# Patient Record
Sex: Female | Born: 1970 | Race: White | Hispanic: No | Marital: Married | Smoking: Current every day smoker
Health system: Southern US, Community
[De-identification: ages and names within clinical notes are randomized; demographics above are authoritative.]

## PROBLEM LIST (undated history)

## (undated) DIAGNOSIS — E7800 Pure hypercholesterolemia, unspecified: Secondary | ICD-10-CM

## (undated) DIAGNOSIS — K219 Gastro-esophageal reflux disease without esophagitis: Secondary | ICD-10-CM

## (undated) DIAGNOSIS — D649 Anemia, unspecified: Secondary | ICD-10-CM

## (undated) DIAGNOSIS — I10 Essential (primary) hypertension: Secondary | ICD-10-CM

## (undated) HISTORY — DX: Pure hypercholesterolemia, unspecified: E78.00

## (undated) HISTORY — PX: ABDOMINAL HYSTERECTOMY: SHX81

## (undated) HISTORY — DX: Essential (primary) hypertension: I10

## (undated) HISTORY — DX: Gastro-esophageal reflux disease without esophagitis: K21.9

---

## 2002-03-28 HISTORY — PX: APPENDECTOMY: SHX54

## 2017-10-31 ENCOUNTER — Ambulatory Visit (INDEPENDENT_AMBULATORY_CARE_PROVIDER_SITE_OTHER): Payer: Self-pay | Admitting: Cardiovascular Disease

## 2017-10-31 ENCOUNTER — Encounter: Payer: Self-pay | Admitting: *Deleted

## 2017-10-31 ENCOUNTER — Encounter: Payer: Self-pay | Admitting: Cardiovascular Disease

## 2017-10-31 VITALS — BP 144/86 | HR 70 | Ht 68.0 in | Wt 214.0 lb

## 2017-10-31 DIAGNOSIS — R9431 Abnormal electrocardiogram [ECG] [EKG]: Secondary | ICD-10-CM

## 2017-10-31 DIAGNOSIS — I10 Essential (primary) hypertension: Secondary | ICD-10-CM

## 2017-10-31 DIAGNOSIS — E785 Hyperlipidemia, unspecified: Secondary | ICD-10-CM

## 2017-10-31 DIAGNOSIS — R079 Chest pain, unspecified: Secondary | ICD-10-CM

## 2017-10-31 MED ORDER — ASPIRIN EC 81 MG PO TBEC: 81.0000 mg | DELAYED_RELEASE_TABLET | Freq: Every day | ORAL | Status: DC

## 2017-10-31 NOTE — Progress Notes (Signed)
CARDIOLOGY CONSULT NOTE  Patient ID: [name] MRN: [id number] DOB/AGE: [birth year] 47 y.o.  Admit date: (Not on file) Primary Physician: Manon Hilding, MD Referring Physician: Manon Hilding, MD  Reason for Consultation: Chest pain  HPI: Amanda Morrow is a 47 y.o. female who is being seen today for the evaluation of chest pain at the request of Sasser, Silvestre Moment, MD.   Past medical history includes hypertension.  I reviewed records from her PCP.  It appears she has been expensing chest pain radiating to her left arm.  I reviewed an ECG performed on 10/04/2017 which demonstrated normal sinus rhythm with T wave inversions in 1, aVL, and V4 through V6 with nonspecific T wave abnormalities in V3.  She has been experiencing intermittent retrosternal chest pain for the past 5 weeks.  She describes as a dull ache.  It is associated with left shoulder discomfort.  Symptoms can occur both at rest and with exertion.  She was prescribed nitroglycerin by her PCP but has not had to use any.  She says she has hypercholesterolemia but is not currently being treated for it.  She has occasional palpitations.  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  She said she has had hypertension and been treated for it since 2004.  She also had pregnancy-induced hypertension at the age of 39.  She quit smoking on New Year's Eve of this year.  She had been smoking a pack of cigarettes daily for 3 to 4 years.  Family history: Father had three-vessel CABG in his late 11s.  He was a non-smoker.  Maternal grandfather had four-vessel CABG.   Allergies  Allergen Reactions  . Morphine And Related     "makes me violent"     Current Outpatient Medications  Medication Sig Dispense Refill  . hydrochlorothiazide (HYDRODIURIL) 25 MG tablet Take 25 mg by mouth daily.    . nitroGLYCERIN (NITROSTAT) 0.4 MG SL tablet Place 0.4 mg under the tongue every 5 (five) minutes as needed for chest pain.    Marland Kitchen  omeprazole (PRILOSEC OTC) 20 MG tablet Take 20 mg by mouth daily.     No current facility-administered medications for this visit.     Past Medical History:  Diagnosis Date  . Gastroesophageal reflux   . Hypercholesterolemia   . Hypertension     Past Surgical History:  Procedure Laterality Date  . APPENDECTOMY  2004    Social History   Socioeconomic History  . Marital status: Married    Spouse name: Not on file  . Number of children: Not on file  . Years of education: Not on file  . Highest education level: Not on file  Occupational History  . Not on file  Social Needs  . Financial resource strain: Not on file  . Food insecurity:    Worry: Not on file    Inability: Not on file  . Transportation needs:    Medical: Not on file    Non-medical: Not on file  Tobacco Use  . Smoking status: Former Smoker    Types: Cigarettes  . Smokeless tobacco: Never Used  Substance and Sexual Activity  . Alcohol use: Not on file  . Drug use: Not on file  . Sexual activity: Not on file  Lifestyle  . Physical activity:    Days per week: Not on file    Minutes per session: Not on file  . Stress: Not on file  Relationships  .  Social connections:    Talks on phone: Not on file    Gets together: Not on file    Attends religious service: Not on file    Active member of club or organization: Not on file    Attends meetings of clubs or organizations: Not on file    Relationship status: Not on file  . Intimate partner violence:    Fear of current or ex partner: Not on file    Emotionally abused: Not on file    Physically abused: Not on file    Forced sexual activity: Not on file  Other Topics Concern  . Not on file  Social History Narrative  . Not on file     No family history of premature CAD in 1st degree relatives.  Current Meds  Medication Sig  . hydrochlorothiazide (HYDRODIURIL) 25 MG tablet Take 25 mg by mouth daily.  . nitroGLYCERIN (NITROSTAT) 0.4 MG SL tablet Place  0.4 mg under the tongue every 5 (five) minutes as needed for chest pain.  Marland Kitchen omeprazole (PRILOSEC OTC) 20 MG tablet Take 20 mg by mouth daily.      Review of systems complete and found to be negative unless listed above in HPI    Physical exam Blood pressure (!) 144/86, pulse 70, height 5\' 8"  (1.727 m), weight 214 lb (97.1 kg), SpO2 96 %. General: NAD Neck: No JVD, no thyromegaly or thyroid nodule.  Lungs: Clear to auscultation bilaterally with normal respiratory effort. CV: Nondisplaced PMI. Regular rate and rhythm, normal S1/S2, no S3/S4, no murmur.  No peripheral edema.  No carotid bruit.  Abdomen: Soft, nontender, no distention.  Skin: Intact without lesions or rashes.  Neurologic: Alert and oriented x 3.  Psych: Normal affect. Extremities: No clubbing or cyanosis.  HEENT: Normal.   ECG: Most recent ECG reviewed.   Labs: No results found for: K, BUN, CREATININE, ALT, TSH, HGB   Lipids: No results found for: LDLCALC, LDLDIRECT, CHOL, TRIG, HDL      ASSESSMENT AND PLAN:  1.  Chest pain with abnormal ECG: ECG is markedly abnormal suspicious for ischemic heart disease.  There is a family history of coronary artery disease.  I will obtain an exercise Myoview stress test.  She has sublingual nitroglycerin but has not had to use it.  I will start aspirin 81 mg daily.  I will obtain a copy of lipids from PCP. I will order a 2-D echocardiogram with Doppler to evaluate cardiac structure, function, and regional wall motion.  2.  Hypertension: Blood pressure is mildly elevated.  This will need further monitoring.  3.  Hypercholesteremia: I will obtain a copy of lipids from PCP.    Disposition: Follow up in next several weeks after cardiac testing  Signed: Kate Sable, M.D., F.A.C.C.  10/31/2017, 9:16 AM

## 2017-10-31 NOTE — Patient Instructions (Signed)
Medication Instructions:   Begin Aspirin 81mg  daily.   Continue all other medications.    Labwork: none  Testing/Procedures:  Your physician has requested that you have an echocardiogram. Echocardiography is a painless test that uses sound waves to create images of your heart. It provides your doctor with information about the size and shape of your heart and how well your heart's chambers and valves are working. This procedure takes approximately one hour. There are no restrictions for this procedure.  Your physician has requested that you have an exercise stress myoview. For further information please visit [email or web address]. Please follow instruction sheet, as given.  Office will contact with results via phone or letter.    Follow-Up: Next available with Dr. Bronson Ing   Any Other Special Instructions Will Be Listed Below (If Applicable).  If you need a refill on your cardiac medications before your next appointment, please call your pharmacy.

## 2017-11-02 ENCOUNTER — Ambulatory Visit (HOSPITAL_COMMUNITY)
Admission: RE | Admit: 2017-11-02 | Discharge: 2017-11-02 | Disposition: A | Discharge status: Home / Self Care | Payer: Self-pay | Source: Ambulatory Visit | Attending: Cardiovascular Disease | Admitting: Cardiovascular Disease

## 2017-11-02 ENCOUNTER — Encounter (HOSPITAL_COMMUNITY)
Admission: RE | Admit: 2017-11-02 | Discharge: 2017-11-02 | Disposition: A | Discharge status: Home / Self Care | Payer: Self-pay | Source: Ambulatory Visit | Attending: Cardiovascular Disease | Admitting: Cardiovascular Disease

## 2017-11-02 ENCOUNTER — Encounter (HOSPITAL_BASED_OUTPATIENT_CLINIC_OR_DEPARTMENT_OTHER)
Admission: RE | Admit: 2017-11-02 | Discharge: 2017-11-02 | Disposition: A | Discharge status: Home / Self Care | Payer: Self-pay | Source: Ambulatory Visit | Attending: Cardiovascular Disease | Admitting: Cardiovascular Disease

## 2017-11-02 ENCOUNTER — Encounter (HOSPITAL_COMMUNITY): Payer: Self-pay

## 2017-11-02 DIAGNOSIS — R079 Chest pain, unspecified: Secondary | ICD-10-CM | POA: Insufficient documentation

## 2017-11-02 DIAGNOSIS — E7800 Pure hypercholesterolemia, unspecified: Secondary | ICD-10-CM | POA: Insufficient documentation

## 2017-11-02 DIAGNOSIS — R9431 Abnormal electrocardiogram [ECG] [EKG]: Secondary | ICD-10-CM

## 2017-11-02 DIAGNOSIS — K219 Gastro-esophageal reflux disease without esophagitis: Secondary | ICD-10-CM | POA: Insufficient documentation

## 2017-11-02 DIAGNOSIS — I10 Essential (primary) hypertension: Secondary | ICD-10-CM | POA: Insufficient documentation

## 2017-11-02 MED ORDER — TECHNETIUM TC 99M TETROFOSMIN IV KIT
10.0000 | PACK | Freq: Once | INTRAVENOUS | Status: AC | PRN
  Administered 2017-11-02: 10.3 via INTRAVENOUS

## 2017-11-02 MED ORDER — TECHNETIUM TC 99M TETROFOSMIN IV KIT
30.0000 | PACK | Freq: Once | INTRAVENOUS | Status: AC | PRN
  Administered 2017-11-02: 29 via INTRAVENOUS

## 2017-11-02 MED ORDER — SODIUM CHLORIDE 0.9% FLUSH
INTRAVENOUS | Status: AC
  Administered 2017-11-02: 10 mL via INTRAVENOUS
  Filled 2017-11-02: qty 10

## 2017-11-02 MED ORDER — REGADENOSON 0.4 MG/5ML IV SOLN
INTRAVENOUS | Status: AC
  Filled 2017-11-02: qty 5

## 2017-11-02 NOTE — Progress Notes (Signed)
*  PRELIMINARY RESULTS* Echocardiogram 2D Echocardiogram has been performed.  Amanda Morrow 11/02/2017, 11:16 AM

## 2017-11-03 LAB — NM MYOCAR MULTI W/SPECT W/WALL MOTION / EF
CHL CUP NUCLEAR SRS: 1
CHL CUP RESTING HR STRESS: 66 {beats}/min
CHL RATE OF PERCEIVED EXERTION: 14
CSEPEDS: 17 s
CSEPEW: 10.4 METS
Exercise duration (min): 9 min
LV dias vol: 55 mL (ref 46–106)
LVSYSVOL: 23 mL
MPHR: 173 {beats}/min
Peak HR: 150 {beats}/min
Percent HR: 86 %
RATE: 0.39
SDS: 2
SSS: 3
TID: 0.92

## 2017-11-08 ENCOUNTER — Encounter: Payer: Self-pay | Admitting: *Deleted

## 2017-11-17 ENCOUNTER — Telehealth: Payer: Self-pay | Admitting: *Deleted

## 2017-11-17 ENCOUNTER — Encounter: Payer: Self-pay | Admitting: *Deleted

## 2017-11-17 NOTE — Telephone Encounter (Signed)
Notes recorded by Laurine Blazer, LPN on 12/11/567 at 7:94 PM EDT Patient notified. Copy to pmd. Follow up scheduled for 12/12/2017 in [place] office.   ------  Notes recorded by Laurine Blazer, LPN on 10/27/6551 at 7:48 PM EDT No answer.  ------  Notes recorded by Laurine Blazer, LPN on 2/70/7867 at 5:44 AM EDT No answer. ------  Notes recorded by Herminio Commons, MD on 11/06/2017 at 8:42 AM EDT Low risk for blockages. I will discuss further at follow up visit.

## 2017-12-12 ENCOUNTER — Ambulatory Visit (INDEPENDENT_AMBULATORY_CARE_PROVIDER_SITE_OTHER): Payer: Self-pay | Admitting: Cardiovascular Disease

## 2017-12-12 ENCOUNTER — Encounter: Payer: Self-pay | Admitting: Cardiovascular Disease

## 2017-12-12 VITALS — BP 144/88 | HR 74 | Ht 68.0 in | Wt 222.0 lb

## 2017-12-12 DIAGNOSIS — E785 Hyperlipidemia, unspecified: Secondary | ICD-10-CM

## 2017-12-12 DIAGNOSIS — I10 Essential (primary) hypertension: Secondary | ICD-10-CM

## 2017-12-12 DIAGNOSIS — R9431 Abnormal electrocardiogram [ECG] [EKG]: Secondary | ICD-10-CM

## 2017-12-12 DIAGNOSIS — R079 Chest pain, unspecified: Secondary | ICD-10-CM

## 2017-12-12 DIAGNOSIS — F064 Anxiety disorder due to known physiological condition: Secondary | ICD-10-CM

## 2017-12-12 DIAGNOSIS — F410 Panic disorder [episodic paroxysmal anxiety] without agoraphobia: Secondary | ICD-10-CM

## 2017-12-12 MED ORDER — AMLODIPINE BESYLATE 5 MG PO TABS: 5.0000 mg | ORAL_TABLET | Freq: Every day | ORAL | 3 refills | Status: DC

## 2017-12-12 NOTE — Patient Instructions (Signed)
Your physician recommends that you schedule a follow-up appointment in:  4 months with Dr.Koneswaran      START Amlodipine 5 mg daily    If you need a refill on your cardiac medications before your next appointment, please call your pharmacy.      No lab work or tests ordered today.      Thank you for choosing [facility] !

## 2017-12-12 NOTE — Progress Notes (Signed)
SUBJECTIVE: The patient returns for follow-up after undergoing cardiovascular testing performed for the evaluation of chest pain.  The patient underwent a low risk nuclear stress test on 11/02/2017.  There was horizontal ST segment depression during stress in leads III and aVF returning to baseline after less than 1 minute of recovery.  Myocardial perfusion was normal.  Echocardiogram demonstrated normal left ventricular systolic function, LVEF 60 to 65%.  She has had some chest pains since her last visit with me but none have been as severe when she initially experience them.  She previously lost 63 pounds.  She knows she is carrying excessive weight and plans to lose it.  She has had some anxiety and panic attacks recently.  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  Family history: Father had three-vessel CABG in his late 46s.  He was a non-smoker.  Maternal grandfather had four-vessel CABG.   Review of Systems: As per "subjective", otherwise negative.  Allergies  Allergen Reactions  . Morphine And Related     "makes me violently sick"     Current Outpatient Medications  Medication Sig Dispense Refill  . aspirin EC 81 MG tablet Take 1 tablet (81 mg total) by mouth daily.    . hydrochlorothiazide (HYDRODIURIL) 25 MG tablet Take 25 mg by mouth daily.    . nitroGLYCERIN (NITROSTAT) 0.4 MG SL tablet Place 0.4 mg under the tongue every 5 (five) minutes as needed for chest pain.    Marland Kitchen omeprazole (PRILOSEC OTC) 20 MG tablet Take 20 mg by mouth daily.     No current facility-administered medications for this visit.     Past Medical History:  Diagnosis Date  . Gastroesophageal reflux   . Hypercholesterolemia   . Hypertension     Past Surgical History:  Procedure Laterality Date  . APPENDECTOMY  2004    Social History   Socioeconomic History  . Marital status: Married    Spouse name: Not on file  . Number of children: Not on file  . Years of education:  Not on file  . Highest education level: Not on file  Occupational History  . Not on file  Social Needs  . Financial resource strain: Not on file  . Food insecurity:    Worry: Not on file    Inability: Not on file  . Transportation needs:    Medical: Not on file    Non-medical: Not on file  Tobacco Use  . Smoking status: Former Smoker    Types: Cigarettes    Last attempt to quit: 03/13/2017    Years since quitting: 0.7  . Smokeless tobacco: Never Used  Substance and Sexual Activity  . Alcohol use: Yes    Comment: occasionally  . Drug use: Not Currently  . Sexual activity: Not on file  Lifestyle  . Physical activity:    Days per week: Not on file    Minutes per session: Not on file  . Stress: Not on file  Relationships  . Social connections:    Talks on phone: Not on file    Gets together: Not on file    Attends religious service: Not on file    Active member of club or organization: Not on file    Attends meetings of clubs or organizations: Not on file    Relationship status: Not on file  . Intimate partner violence:    Fear of current or ex partner: Not on file    Emotionally  abused: Not on file    Physically abused: Not on file    Forced sexual activity: Not on file  Other Topics Concern  . Not on file  Social History Narrative  . Not on file     Vitals:   12/12/17 0950  BP: (!) 144/88  Pulse: 74  SpO2: 98%  Weight: 222 lb (100.7 kg)  Height: 5\' 8"  (1.727 m)    Wt Readings from Last 3 Encounters:  12/12/17 222 lb (100.7 kg)  10/31/17 214 lb (97.1 kg)  10/04/17 214 lb (97.1 kg)     PHYSICAL EXAM General: NAD HEENT: Normal. Neck: No JVD, no thyromegaly. Lungs: Clear to auscultation bilaterally with normal respiratory effort. CV: Regular rate and rhythm, normal S1/S2, no S3/S4, no murmur. No pretibial or periankle edema.  No carotid bruit.   Abdomen: Soft, nontender, no distention.  Neurologic: Alert and oriented.  Psych: Normal affect. Skin:  Normal. Musculoskeletal: No gross deformities.    ECG: Reviewed above under Subjective   Labs: No results found for: K, BUN, CREATININE, ALT, TSH, HGB   Lipids: No results found for: LDLCALC, LDLDIRECT, CHOL, TRIG, HDL     ASSESSMENT AND PLAN:  1.  Chest pain with abnormal ECG: ECG is markedly abnormal and suspicious for ischemic heart disease.  There is a family history of coronary artery disease.  However, nuclear stress test was low risk as detailed above.  Myocardial perfusion was normal but there were ECG abnormalities.  Echocardiogram demonstrated normal left ventricular systolic function.  For the time being, I will aim to control blood pressure.  2.  Hypertension: Blood pressure is mildly elevated.    I will start amlodipine 5 mg daily.  3.  Hypercholesteremia: I reviewed lipids dated 06/20/2017.  LDL was 133, total cholesterol 217, triglycerides 278, HDL 41.  4.  Anxiety and panic attacks: I asked her to speak with her PCP regarding this.   Disposition: Follow up 4 months   Kate Sable, M.D., F.A.C.C.

## 2018-04-11 ENCOUNTER — Encounter (INDEPENDENT_AMBULATORY_CARE_PROVIDER_SITE_OTHER): Payer: Self-pay

## 2018-04-11 ENCOUNTER — Ambulatory Visit (INDEPENDENT_AMBULATORY_CARE_PROVIDER_SITE_OTHER): Payer: Self-pay | Admitting: Cardiovascular Disease

## 2018-04-11 ENCOUNTER — Encounter: Payer: Self-pay | Admitting: Cardiovascular Disease

## 2018-04-11 VITALS — BP 132/84 | HR 79 | Ht 68.0 in | Wt 224.0 lb

## 2018-04-11 DIAGNOSIS — I10 Essential (primary) hypertension: Secondary | ICD-10-CM

## 2018-04-11 DIAGNOSIS — R079 Chest pain, unspecified: Secondary | ICD-10-CM

## 2018-04-11 DIAGNOSIS — F410 Panic disorder [episodic paroxysmal anxiety] without agoraphobia: Secondary | ICD-10-CM

## 2018-04-11 DIAGNOSIS — F064 Anxiety disorder due to known physiological condition: Secondary | ICD-10-CM

## 2018-04-11 DIAGNOSIS — E785 Hyperlipidemia, unspecified: Secondary | ICD-10-CM

## 2018-04-11 DIAGNOSIS — R9431 Abnormal electrocardiogram [ECG] [EKG]: Secondary | ICD-10-CM

## 2018-04-11 NOTE — Progress Notes (Signed)
SUBJECTIVE: The patient presents for routine follow-up.  She has a history of chest pain, family history of premature coronary artery disease, anxiety and panic attacks.  The patient underwent a low risk nuclear stress test on 11/02/2017.  There was horizontal ST segment depression during stress in leads III and aVF returning to baseline after less than 1 minute of recovery.  Myocardial perfusion was normal.  Echocardiogram demonstrated normal left ventricular systolic function, LVEF 60 to 65%.  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  She is feeling much better and denies chest pain, palpitations, orthopnea, and shortness of breath.  She recently had some dizziness which she attributes to labyrinthitis.   Family history:Father had three-vessel CABG in his late 71s. He was a non-smoker. Maternal grandfather had four-vessel CABG.  Review of Systems: As per "subjective", otherwise negative.  Allergies  Allergen Reactions  . Morphine And Related     "makes me violently sick"     Current Outpatient Medications  Medication Sig Dispense Refill  . amLODipine (NORVASC) 5 MG tablet Take 1 tablet (5 mg total) by mouth daily. 90 tablet 3  . aspirin EC 81 MG tablet Take 1 tablet (81 mg total) by mouth daily.    . hydrochlorothiazide (HYDRODIURIL) 25 MG tablet Take 25 mg by mouth daily.    . nitroGLYCERIN (NITROSTAT) 0.4 MG SL tablet Place 0.4 mg under the tongue every 5 (five) minutes as needed for chest pain.    Marland Kitchen omeprazole (PRILOSEC OTC) 20 MG tablet Take 20 mg by mouth daily.     No current facility-administered medications for this visit.     Past Medical History:  Diagnosis Date  . Gastroesophageal reflux   . Hypercholesterolemia   . Hypertension     Past Surgical History:  Procedure Laterality Date  . APPENDECTOMY  2004    Social History   Socioeconomic History  . Marital status: Married    Spouse name: Not on file  . Number of children: Not  on file  . Years of education: Not on file  . Highest education level: Not on file  Occupational History  . Not on file  Social Needs  . Financial resource strain: Not on file  . Food insecurity:    Worry: Not on file    Inability: Not on file  . Transportation needs:    Medical: Not on file    Non-medical: Not on file  Tobacco Use  . Smoking status: Former Smoker    Types: Cigarettes    Last attempt to quit: 03/13/2017    Years since quitting: 1.0  . Smokeless tobacco: Never Used  Substance and Sexual Activity  . Alcohol use: Yes    Comment: occasionally  . Drug use: Not Currently  . Sexual activity: Not on file  Lifestyle  . Physical activity:    Days per week: Not on file    Minutes per session: Not on file  . Stress: Not on file  Relationships  . Social connections:    Talks on phone: Not on file    Gets together: Not on file    Attends religious service: Not on file    Active member of club or organization: Not on file    Attends meetings of clubs or organizations: Not on file    Relationship status: Not on file  . Intimate partner violence:    Fear of current or ex partner: Not on file    Emotionally abused:  Not on file    Physically abused: Not on file    Forced sexual activity: Not on file  Other Topics Concern  . Not on file  Social History Narrative  . Not on file     Vitals:   04/11/18 1038  BP: 132/84  Pulse: 79  SpO2: 96%  Weight: 224 lb (101.6 kg)  Height: 5\' 8"  (1.727 m)    Wt Readings from Last 3 Encounters:  04/11/18 224 lb (101.6 kg)  12/12/17 222 lb (100.7 kg)  10/31/17 214 lb (97.1 kg)     PHYSICAL EXAM General: NAD HEENT: Normal. Neck: No JVD, no thyromegaly. Lungs: Clear to auscultation bilaterally with normal respiratory effort. CV: Regular rate and rhythm, normal S1/S2, no S3/S4, no murmur. No pretibial or periankle edema.  No carotid bruit.   Abdomen: Soft, nontender, no distention.  Neurologic: Alert and oriented.    Psych: Normal affect. Skin: Normal. Musculoskeletal: No gross deformities.    ECG: Reviewed above under Subjective   Labs: No results found for: K, BUN, CREATININE, ALT, TSH, HGB   Lipids: No results found for: LDLCALC, LDLDIRECT, CHOL, TRIG, HDL     ASSESSMENT AND PLAN: 1. Chest pain with abnormal ECG: Symptomatically improved with blood pressure control.  ECG is markedly abnormal and suspicious for ischemic heart disease. There is a family history of coronary artery disease.  However, nuclear stress test was low risk as detailed above.  Myocardial perfusion was normal but there were ECG abnormalities.  Echocardiogram demonstrated normal left ventricular systolic function.  I will continue to monitor.  2. Hypertension: Blood pressure is is normal with addition of amlodipine 5 mg daily.  No changes.  3. Hypercholesteremia: I reviewed lipids dated 06/20/2017.  LDL was 133, total cholesterol 217, triglycerides 278, HDL 41.  4.  Anxiety and panic attacks: Symptoms have improved.  I previously asked her to speak with her PCP regarding this.    Disposition: Follow up 6 months   Kate Sable, M.D., F.A.C.C.

## 2018-04-11 NOTE — Patient Instructions (Signed)
Medication Instructions:  Your physician recommends that you continue on your current medications as directed. Please refer to the Current Medication list given to you today.  If you need a refill on your cardiac medications before your next appointment, please call your pharmacy.   Lab work: NONE   If you have labs (blood work) drawn today and your tests are completely normal, you will receive your results only by: . MyChart Message (if you have MyChart) OR . A paper copy in the mail If you have any lab test that is abnormal or we need to change your treatment, we will call you to review the results.  Testing/Procedures: NONE   Follow-Up: At [facility], you and your health needs are our priority.  As part of our continuing mission to provide you with exceptional heart care, we have created designated Provider Care Teams.  These Care Teams include your primary Cardiologist (physician) and Advanced Practice Providers (APPs -  Physician Assistants and Nurse Practitioners) who all work together to provide you with the care you need, when you need it. You will need a follow up appointment in 6 months.  Please call our office 2 months in advance to schedule this appointment.  You may see Suresh Koneswaran, MD or one of the following Advanced Practice Providers on your designated Care Team:   Brittany Strader, PA-C ([place] Office) . Michele Lenze, PA-C ([place] Office)  Any Other Special Instructions Will Be Listed Below (If Applicable). Thank you for choosing [facility]!     

## 2018-10-18 ENCOUNTER — Other Ambulatory Visit: Payer: Self-pay

## 2018-10-18 DIAGNOSIS — Z20822 Contact with and (suspected) exposure to covid-19: Secondary | ICD-10-CM

## 2018-10-21 LAB — NOVEL CORONAVIRUS, NAA: SARS-CoV-2, NAA: NOT DETECTED

## 2018-10-31 ENCOUNTER — Ambulatory Visit: Payer: Self-pay | Admitting: Cardiovascular Disease

## 2018-12-07 ENCOUNTER — Other Ambulatory Visit: Payer: Self-pay | Admitting: *Deleted

## 2018-12-07 MED ORDER — AMLODIPINE BESYLATE 5 MG PO TABS: 5.0000 mg | ORAL_TABLET | Freq: Every day | ORAL | 1 refills | Status: DC

## 2018-12-31 ENCOUNTER — Encounter: Payer: Self-pay | Admitting: Cardiovascular Disease

## 2018-12-31 ENCOUNTER — Other Ambulatory Visit: Payer: Self-pay

## 2018-12-31 ENCOUNTER — Ambulatory Visit (INDEPENDENT_AMBULATORY_CARE_PROVIDER_SITE_OTHER): Payer: Self-pay | Admitting: Cardiovascular Disease

## 2018-12-31 VITALS — BP 158/78 | HR 85 | Ht 68.0 in

## 2018-12-31 DIAGNOSIS — R079 Chest pain, unspecified: Secondary | ICD-10-CM

## 2018-12-31 DIAGNOSIS — R9431 Abnormal electrocardiogram [ECG] [EKG]: Secondary | ICD-10-CM

## 2018-12-31 DIAGNOSIS — I10 Essential (primary) hypertension: Secondary | ICD-10-CM

## 2018-12-31 MED ORDER — AMLODIPINE BESYLATE 10 MG PO TABS: 10.0000 mg | ORAL_TABLET | Freq: Every day | ORAL | 1 refills | Status: DC

## 2018-12-31 NOTE — Patient Instructions (Addendum)
Medication Instructions:   Increase Amlodipine to 10mg  daily.   Continue all other current medications.  Labwork: none  Testing/Procedures: none  Follow-Up: Your physician wants you to follow up in: 6 months.  You will receive a reminder letter in the mail one-two months in advance.  If you don't receive a letter, please call our office to schedule the follow up appointment   Any Other Special Instructions Will Be Listed Below (If Applicable).  If you need a refill on your cardiac medications before your next appointment, please call your pharmacy.

## 2018-12-31 NOTE — Progress Notes (Signed)
SUBJECTIVE:  The patient presents for routine follow-up.  She has a history of chest pain, family history of premature coronary artery disease, anxiety and panic attacks.  The patient underwent a low risk nuclear stress test on 11/02/2017. There was horizontal ST segment depression during stress in leads IIIand aVF returning to baseline after less than 1 minute of recovery. Myocardial perfusion was normal.  Echocardiogram demonstrated normal left ventricular systolic function, LVEF 60 to 65%.  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  The patient denies any symptoms of chest pain, palpitations, shortness of breath, lightheadedness, dizziness, leg swelling, orthopnea, PND, and syncope.  She apparently became anemic and had to be transfused 2 units earlier this year.  She said it was due to heavy menses and she needs a hysterectomy.  Aspirin was discontinued.  She checks her blood pressure about twice per week and it runs in the 150/70 range.  ECG performed today which I personally reviewed demonstrates sinus rhythm with pronounced T wave inversions in leads I and aVL and nonspecific T wave abnormalities in V5 and V6.   Family history:Father had three-vessel CABG in his late 82s. He was a non-smoker. Maternal grandfather had four-vessel CABG.  Review of Systems: As per "subjective", otherwise negative.  Allergies  Allergen Reactions  . Morphine And Related     "makes me violently sick"     Current Outpatient Medications  Medication Sig Dispense Refill  . amLODipine (NORVASC) 5 MG tablet Take 1 tablet (5 mg total) by mouth daily. 90 tablet 1  . Ferrous Sulfate (IRON) 325 (65 Fe) MG TABS Take by mouth.    . hydrochlorothiazide (HYDRODIURIL) 25 MG tablet Take 25 mg by mouth daily.    . nitroGLYCERIN (NITROSTAT) 0.4 MG SL tablet Place 0.4 mg under the tongue every 5 (five) minutes as needed for chest pain.    . potassium chloride (KLOR-CON) 20 MEQ packet Take  20 mEq by mouth daily.     No current facility-administered medications for this visit.     Past Medical History:  Diagnosis Date  . Gastroesophageal reflux   . Hypercholesterolemia   . Hypertension     Past Surgical History:  Procedure Laterality Date  . APPENDECTOMY  2004    Social History   Socioeconomic History  . Marital status: Married    Spouse name: Not on file  . Number of children: Not on file  . Years of education: Not on file  . Highest education level: Not on file  Occupational History  . Not on file  Social Needs  . Financial resource strain: Not on file  . Food insecurity    Worry: Not on file    Inability: Not on file  . Transportation needs    Medical: Not on file    Non-medical: Not on file  Tobacco Use  . Smoking status: Former Smoker    Types: Cigarettes    Quit date: 03/13/2017    Years since quitting: 1.8  . Smokeless tobacco: Never Used  Substance and Sexual Activity  . Alcohol use: Yes    Comment: occasionally  . Drug use: Not Currently  . Sexual activity: Not on file  Lifestyle  . Physical activity    Days per week: Not on file    Minutes per session: Not on file  . Stress: Not on file  Relationships  . Social Herbalist on phone: Not on file    Gets  together: Not on file    Attends religious service: Not on file    Active member of club or organization: Not on file    Attends meetings of clubs or organizations: Not on file    Relationship status: Not on file  . Intimate partner violence    Fear of current or ex partner: Not on file    Emotionally abused: Not on file    Physically abused: Not on file    Forced sexual activity: Not on file  Other Topics Concern  . Not on file  Social History Narrative  . Not on file     Vitals:   12/31/18 1414  BP: (!) 158/78  Pulse: 85  SpO2: 98%  Height: 5\' 8"  (1.727 m)    Wt Readings from Last 3 Encounters:  04/11/18 224 lb (101.6 kg)  12/12/17 222 lb (100.7 kg)   10/31/17 214 lb (97.1 kg)     PHYSICAL EXAM General: NAD HEENT: Normal. Neck: No JVD, no thyromegaly. Lungs: Clear to auscultation bilaterally with normal respiratory effort. CV: Regular rate and rhythm, normal S1/S2, no S3/S4, no murmur. No pretibial or periankle edema.  No carotid bruit.   Abdomen: Soft, nontender, no distention.  Neurologic: Alert and oriented.  Psych: Normal affect. Skin: Normal. Musculoskeletal: No gross deformities.      Labs: No results found for: K, BUN, CREATININE, ALT, TSH, HGB   Lipids: No results found for: LDLCALC, LDLDIRECT, CHOL, TRIG, HDL     ASSESSMENT AND PLAN: 1. Chest pain with abnormal ECG:  She denies anginal symptoms.  ECG is markedly abnormalandsuspicious for ischemic heart disease. There is a family history of coronary artery disease.However, nuclear stress test was low risk as detailed above. Myocardial perfusion was normal but there were ECG abnormalities. Echocardiogram demonstrated normal left ventricular systolic function.  I will continue to monitor.  Aspirin was stopped due to anemia.  2. Hypertension: Blood pressure is  markedly elevated.  I will increase amlodipine to 10 mg daily.    Disposition: Follow up 6 months   Kate Sable, M.D., F.A.C.C.

## 2019-01-01 ENCOUNTER — Encounter: Payer: Self-pay | Admitting: *Deleted

## 2019-04-08 ENCOUNTER — Other Ambulatory Visit: Payer: Self-pay | Admitting: Cardiovascular Disease

## 2019-08-13 ENCOUNTER — Ambulatory Visit (INDEPENDENT_AMBULATORY_CARE_PROVIDER_SITE_OTHER): Payer: Self-pay | Admitting: Cardiovascular Disease

## 2019-08-13 ENCOUNTER — Encounter: Payer: Self-pay | Admitting: Cardiovascular Disease

## 2019-08-13 ENCOUNTER — Other Ambulatory Visit: Payer: Self-pay

## 2019-08-13 VITALS — BP 138/80 | HR 78 | Ht 68.0 in | Wt 220.0 lb

## 2019-08-13 DIAGNOSIS — R079 Chest pain, unspecified: Secondary | ICD-10-CM

## 2019-08-13 DIAGNOSIS — I10 Essential (primary) hypertension: Secondary | ICD-10-CM

## 2019-08-13 DIAGNOSIS — R9431 Abnormal electrocardiogram [ECG] [EKG]: Secondary | ICD-10-CM

## 2019-08-13 NOTE — Progress Notes (Signed)
SUBJECTIVE:  The patient presents for routine follow-up.  She has a history of chest pain, family history of premature coronary artery disease, anxiety and panic attacks.  The patient underwent a low risk nuclear stress test on 11/02/2017. There was horizontal ST segment depression during stress in leads IIIand aVF returning to baseline after less than 1 minute of recovery. Myocardial perfusion was normal.  Echocardiogram demonstrated normal left ventricular systolic function, LVEF 60 to 65%.  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  The patient denies any symptoms of chest pain, palpitations, shortness of breath, lightheadedness, dizziness, orthopnea, PND, and syncope.  She has occasional ankle and feet swelling if she has been up on her feet for long periods of time.  She apparently became anemic and had to be transfused 2 units in 2020.  She said it was due to heavy menses and she needed a hysterectomy.  Aspirin was discontinued.    Family history:Father had three-vessel CABG in his late 38s. He was a non-smoker. Maternal grandfather had four-vessel CABG.  Review of Systems: As per "subjective", otherwise negative.  Allergies  Allergen Reactions  . Morphine And Related     "makes me violently sick"     Current Outpatient Medications  Medication Sig Dispense Refill  . amLODipine (NORVASC) 10 MG tablet TAKE ONE TABLET BY MOUTH DAILY. 90 tablet 1  . Ferrous Sulfate (IRON) 325 (65 Fe) MG TABS Take by mouth.    . hydrochlorothiazide (HYDRODIURIL) 25 MG tablet Take 25 mg by mouth daily.    . nitroGLYCERIN (NITROSTAT) 0.4 MG SL tablet Place 0.4 mg under the tongue every 5 (five) minutes as needed for chest pain.    . potassium chloride (KLOR-CON) 20 MEQ packet Take 20 mEq by mouth daily.     No current facility-administered medications for this visit.    Past Medical History:  Diagnosis Date  . Gastroesophageal reflux   . Hypercholesterolemia   .  Hypertension     Past Surgical History:  Procedure Laterality Date  . APPENDECTOMY  2004    Social History   Socioeconomic History  . Marital status: Married    Spouse name: Not on file  . Number of children: Not on file  . Years of education: Not on file  . Highest education level: Not on file  Occupational History  . Not on file  Tobacco Use  . Smoking status: Former Smoker    Types: Cigarettes    Quit date: 03/13/2017    Years since quitting: 2.4  . Smokeless tobacco: Never Used  Substance and Sexual Activity  . Alcohol use: Yes    Comment: occasionally  . Drug use: Not Currently  . Sexual activity: Not on file  Other Topics Concern  . Not on file  Social History Narrative  . Not on file   Social Determinants of Health   Financial Resource Strain:   . Difficulty of Paying Living Expenses:   Food Insecurity:   . Worried About Charity fundraiser in the Last Year:   . Arboriculturist in the Last Year:   Transportation Needs:   . Film/video editor (Medical):   Marland Kitchen Lack of Transportation (Non-Medical):   Physical Activity:   . Days of Exercise per Week:   . Minutes of Exercise per Session:   Stress:   . Feeling of Stress :   Social Connections:   . Frequency of Communication with Friends and Family:   .  Frequency of Social Gatherings with Friends and Family:   . Attends Religious Services:   . Active Member of Clubs or Organizations:   . Attends Archivist Meetings:   Marland Kitchen Marital Status:   Intimate Partner Violence:   . Fear of Current or Ex-Partner:   . Emotionally Abused:   Marland Kitchen Physically Abused:   . Sexually Abused:      Vitals:   08/13/19 1335  BP: 138/80  Pulse: 78  SpO2: 98%  Weight: 220 lb (99.8 kg)  Height: 5\' 8"  (1.727 m)    Wt Readings from Last 3 Encounters:  08/13/19 220 lb (99.8 kg)  04/11/18 224 lb (101.6 kg)  12/12/17 222 lb (100.7 kg)     PHYSICAL EXAM General: NAD HEENT: Normal. Neck: No JVD, no  thyromegaly. Lungs: Clear to auscultation bilaterally with normal respiratory effort. CV: Regular rate and rhythm, normal S1/S2, no S3/S4, no murmur.  Trace bilateral periankle edema.  No carotid bruit.   Abdomen: Soft, nontender, no distention.  Neurologic: Alert and oriented.  Psych: Normal affect. Skin: Normal. Musculoskeletal: No gross deformities.      Labs: No results found for: K, BUN, CREATININE, ALT, TSH, HGB   Lipids: No results found for: LDLCALC, LDLDIRECT, CHOL, TRIG, HDL     ASSESSMENT AND PLAN: 1. Chest pain with abnormal ECG:  She denies anginal symptoms.  ECG is markedly abnormalandsuspicious for ischemic heart disease. There is a family history of coronary artery disease.However, nuclear stress test was low risk as detailed above. Myocardial perfusion was normal but there were ECG abnormalities. Echocardiogram demonstrated normal left ventricular systolic function.  I will continue to monitor.  Aspirin was stopped due to anemia.  2. Hypertension: Blood pressure is  normal.  No changes to therapy.   Disposition: Follow up 1 year   Kate Sable, M.D., F.A.C.C.

## 2019-08-13 NOTE — Patient Instructions (Signed)

## 2019-08-15 ENCOUNTER — Other Ambulatory Visit: Payer: Self-pay

## 2019-08-15 ENCOUNTER — Ambulatory Visit (INDEPENDENT_AMBULATORY_CARE_PROVIDER_SITE_OTHER): Payer: Self-pay | Admitting: Obstetrics & Gynecology

## 2019-08-15 ENCOUNTER — Encounter: Payer: Self-pay | Admitting: Obstetrics & Gynecology

## 2019-08-15 VITALS — BP 157/88 | HR 84 | Ht 68.0 in | Wt 221.0 lb

## 2019-08-15 DIAGNOSIS — N946 Dysmenorrhea, unspecified: Secondary | ICD-10-CM

## 2019-08-15 DIAGNOSIS — D219 Benign neoplasm of connective and other soft tissue, unspecified: Secondary | ICD-10-CM

## 2019-08-15 DIAGNOSIS — N921 Excessive and frequent menstruation with irregular cycle: Secondary | ICD-10-CM

## 2019-08-15 LAB — POCT HEMOGLOBIN: Hemoglobin: 14.8 g/dL — AB (ref 11–14.6)

## 2019-08-15 MED ORDER — MEGESTROL ACETATE 40 MG PO TABS: ORAL_TABLET | ORAL | 3 refills | Status: DC

## 2019-08-15 NOTE — Progress Notes (Signed)
Chief Complaint  Patient presents with  . has heavy periods and cramping    interested in a hyst.      49 y.o. G1P1001 Patient's last menstrual period was 08/08/2019. The current method of family planning is none.  Outpatient Encounter Medications as of 08/15/2019  Medication Sig  . amLODipine (NORVASC) 10 MG tablet TAKE ONE TABLET BY MOUTH DAILY.  Marland Kitchen Ferrous Sulfate (IRON) 325 (65 Fe) MG TABS Take by mouth.  . hydrochlorothiazide (HYDRODIURIL) 25 MG tablet Take 25 mg by mouth daily.  . nitroGLYCERIN (NITROSTAT) 0.4 MG SL tablet Place 0.4 mg under the tongue every 5 (five) minutes as needed for chest pain.  . potassium chloride (KLOR-CON) 20 MEQ packet Take 20 mEq by mouth daily.  . megestrol (MEGACE) 40 MG tablet 3 tablets a day for 5 days, 2 tablets a day for 5 days then 1 tablet daily   No facility-administered encounter medications on file as of 08/15/2019.    Subjective Pt with fibroids 20 weeks size Has heavy long periods Last 2 weeks Was transfused last August 2 units due to anemia  [facility] R Normal Pap Normal EMBx Wears tampons and pads together Soils clothes sheets Double pads at night Sleeps on a towel  Past Medical History:  Diagnosis Date  . Gastroesophageal reflux   . Hypercholesterolemia   . Hypertension     Past Surgical History:  Procedure Laterality Date  . APPENDECTOMY  2004  . CESAREAN SECTION      OB History    Gravida  1   Para  1   Term  1   Preterm      AB      Living  1     SAB      TAB      Ectopic      Multiple      Live Births  1           Allergies  Allergen Reactions  . Morphine And Related     "makes me violently sick"     Social History   Socioeconomic History  . Marital status: Married    Spouse name: Not on file  . Number of children: Not on file  . Years of education: Not on file  . Highest education level: Not on file  Occupational History  . Not on file  Tobacco Use  . Smoking status:  Current Every Day Smoker    Types: Cigarettes    Last attempt to quit: 03/13/2017    Years since quitting: 2.4  . Smokeless tobacco: Never Used  Substance and Sexual Activity  . Alcohol use: Yes    Comment: occasionally  . Drug use: Not Currently  . Sexual activity: Yes    Birth control/protection: None  Other Topics Concern  . Not on file  Social History Narrative  . Not on file   Social Determinants of Health   Financial Resource Strain: Low Risk   . Difficulty of Paying Living Expenses: Not very hard  Food Insecurity: No Food Insecurity  . Worried About Charity fundraiser in the Last Year: Never true  . Ran Out of Food in the Last Year: Never true  Transportation Needs: No Transportation Needs  . Lack of Transportation (Medical): No  . Lack of Transportation (Non-Medical): No  Physical Activity: Inactive  . Days of Exercise per Week: 0 days  . Minutes of Exercise per Session: 0 min  Stress: No Stress  Concern Present  . Feeling of Stress : Not at all  Social Connections: Somewhat Isolated  . Frequency of Communication with Friends and Family: Three times a week  . Frequency of Social Gatherings with Friends and Family: Once a week  . Attends Religious Services: Never  . Active Member of Clubs or Organizations: No  . Attends Archivist Meetings: Never  . Marital Status: Married    Family History  Problem Relation Age of Onset  . Hypertension Mother   . Diabetes Mother   . Cancer Mother   . Diabetes Father   . Hypertension Father   . Cancer Father     Medications:       Current Outpatient Medications:  .  amLODipine (NORVASC) 10 MG tablet, TAKE ONE TABLET BY MOUTH DAILY., Disp: 90 tablet, Rfl: 1 .  Ferrous Sulfate (IRON) 325 (65 Fe) MG TABS, Take by mouth., Disp: , Rfl:  .  hydrochlorothiazide (HYDRODIURIL) 25 MG tablet, Take 25 mg by mouth daily., Disp: , Rfl:  .  nitroGLYCERIN (NITROSTAT) 0.4 MG SL tablet, Place 0.4 mg under the tongue every 5  (five) minutes as needed for chest pain., Disp: , Rfl:  .  potassium chloride (KLOR-CON) 20 MEQ packet, Take 20 mEq by mouth daily., Disp: , Rfl:  .  megestrol (MEGACE) 40 MG tablet, 3 tablets a day for 5 days, 2 tablets a day for 5 days then 1 tablet daily, Disp: 45 tablet, Rfl: 3  Objective Blood pressure (!) 157/88, pulse 84, height 5\' 8"  (1.727 m), weight 221 lb (100.2 kg), last menstrual period 08/08/2019.  General [facility] female NAD Vulva:  normal appearing vulva with no masses, tenderness or lesions Vagina:  normal mucosa, no discharge Cervix:  Normal no lesions Uterus:  20 weeks size Adnexa: ovaries:present,  normal adnexa in size, nontender and no masses   Pertinent ROS No burning with urination, frequency or urgency No nausea, vomiting or diarrhea Nor fever chills or other constitutional symptoms   Labs or studies Reviewed her records    Impression Diagnoses this Encounter::   ICD-10-CM   1. Fibroids, 20 weeks size  D21.9   2. Menometrorrhagia  N92.1 POCT hemoglobin  3. Dysmenorrhea  N94.6     Established relevant diagnosis(es):   Plan/Recommendations: Meds ordered this encounter  Medications  . megestrol (MEGACE) 40 MG tablet    Sig: 3 tablets a day for 5 days, 2 tablets a day for 5 days then 1 tablet daily    Dispense:  45 tablet    Refill:  3    Labs or Scans Ordered: Orders Placed This Encounter  Procedures  . POCT hemoglobin    Management:: Megestrol therapy until surgery [facility] BSO 09/18/19  Follow up Return in about 6 weeks (around 09/24/2019) for [place], with Dr Elonda Husky.      All questions were answered.

## 2019-09-12 ENCOUNTER — Other Ambulatory Visit: Payer: Self-pay | Admitting: Obstetrics & Gynecology

## 2019-09-16 ENCOUNTER — Encounter (HOSPITAL_COMMUNITY): Payer: Self-pay

## 2019-09-16 ENCOUNTER — Other Ambulatory Visit: Payer: Self-pay

## 2019-09-16 ENCOUNTER — Other Ambulatory Visit (HOSPITAL_COMMUNITY)
Admission: RE | Admit: 2019-09-16 | Discharge: 2019-09-16 | Disposition: A | Discharge status: Home / Self Care | Payer: Self-pay | Source: Ambulatory Visit | Attending: Obstetrics & Gynecology | Admitting: Obstetrics & Gynecology

## 2019-09-16 ENCOUNTER — Encounter (HOSPITAL_COMMUNITY)
Admission: RE | Admit: 2019-09-16 | Discharge: 2019-09-16 | Disposition: A | Discharge status: Home / Self Care | Payer: Self-pay | Source: Ambulatory Visit | Attending: Obstetrics & Gynecology | Admitting: Obstetrics & Gynecology

## 2019-09-16 DIAGNOSIS — Z01818 Encounter for other preprocedural examination: Secondary | ICD-10-CM | POA: Insufficient documentation

## 2019-09-16 DIAGNOSIS — Z20822 Contact with and (suspected) exposure to covid-19: Secondary | ICD-10-CM | POA: Insufficient documentation

## 2019-09-16 HISTORY — DX: Anemia, unspecified: D64.9

## 2019-09-16 LAB — URINALYSIS, ROUTINE W REFLEX MICROSCOPIC
Bacteria, UA: NONE SEEN
Bilirubin Urine: NEGATIVE
Glucose, UA: NEGATIVE mg/dL
Ketones, ur: NEGATIVE mg/dL
Leukocytes,Ua: NEGATIVE
Nitrite: NEGATIVE
Protein, ur: NEGATIVE mg/dL
Specific Gravity, Urine: 1.013 (ref 1.005–1.030)
pH: 6 (ref 5.0–8.0)

## 2019-09-16 LAB — COMPREHENSIVE METABOLIC PANEL
ALT: 25 U/L (ref 0–44)
AST: 13 U/L — ABNORMAL LOW (ref 15–41)
Albumin: 4.5 g/dL (ref 3.5–5.0)
Alkaline Phosphatase: 45 U/L (ref 38–126)
Anion gap: 11 (ref 5–15)
BUN: 12 mg/dL (ref 6–20)
CO2: 24 mmol/L (ref 22–32)
Calcium: 9.9 mg/dL (ref 8.9–10.3)
Chloride: 101 mmol/L (ref 98–111)
Creatinine, Ser: 0.8 mg/dL (ref 0.44–1.00)
GFR calc Af Amer: >60 mL/min (ref >60)
GFR calc non Af Amer: >60 mL/min (ref >60)
Glucose, Bld: 108 mg/dL — ABNORMAL HIGH (ref 70–99)
Potassium: 3.3 mmol/L — ABNORMAL LOW (ref 3.5–5.1)
Sodium: 136 mmol/L (ref 135–145)
Total Bilirubin: 0.6 mg/dL (ref 0.3–1.2)
Total Protein: 8.1 g/dL (ref 6.5–8.1)

## 2019-09-16 LAB — CBC
HCT: 43.7 % (ref 36.0–46.0)
Hemoglobin: 14.9 g/dL (ref 12.0–15.0)
MCH: 31.2 pg (ref 26.0–34.0)
MCHC: 34.1 g/dL (ref 30.0–36.0)
MCV: 91.4 fL (ref 80.0–100.0)
Platelets: 337 10*3/uL (ref 150–400)
RBC: 4.78 MIL/uL (ref 3.87–5.11)
RDW: 12.6 % (ref 11.5–15.5)
WBC: 8.4 10*3/uL (ref 4.0–10.5)
nRBC: 0 % (ref 0.0–0.2)

## 2019-09-16 LAB — RAPID HIV SCREEN (HIV 1/2 AB+AG)
HIV 1/2 Antibodies: NONREACTIVE
HIV-1 P24 Antigen - HIV24: NONREACTIVE

## 2019-09-16 LAB — SARS CORONAVIRUS 2 (TAT 6-24 HRS): SARS Coronavirus 2: NEGATIVE

## 2019-09-16 LAB — HCG, QUANTITATIVE, PREGNANCY: hCG, Beta Chain, Quant, S: <1 m[IU]/mL (ref <5)

## 2019-09-16 NOTE — Patient Instructions (Signed)
Amanda Morrow  09/16/2019     @PREFPERIOPPHARMACY @   Your procedure is scheduled on  09/18/2019 .  Report to [facility] at  0700  A.M.  Call this number if you have problems the morning of surgery:  [phone]   Remember:  Do not eat or drink after midnight.                        Take these medicines the morning of surgery with A SIP OF WATER  Amlodipine.    Do not wear jewelry, make-up or nail polish.  Do not wear lotions, powders, or perfumes. Please wear deodorant and brush your teeth.  Do not shave 48 hours prior to surgery.  Men may shave face and neck.  Do not bring valuables to the hospital.  [facility] is not responsible for any belongings or valuables.  Contacts, dentures or bridgework may not be worn into surgery.  Leave your suitcase in the car.  After surgery it may be brought to your room.  For patients admitted to the hospital, discharge time will be determined by your treatment team.  Patients discharged the day of surgery will not be allowed to drive home.   Name and phone number of your driver:   family Special instructions:  DO NOT smoke the morning of your procedure.  Please read over the following fact sheets that you were given. Anesthesia Post-op Instructions and Care and Recovery After Surgery       Bilateral Salpingo-Oophorectomy, Care After This sheet gives you information about how to care for yourself after your procedure. Your health care provider may also give you more specific instructions. If you have problems or questions, contact your health care provider. What can I expect after the procedure? After the procedure, it is common to have:  Abdominal pain.  Some occasional vaginal bleeding (spotting).  Tiredness.  Symptoms of menopause, such as hot flashes, night sweats, or mood swings. Follow these instructions at home: Incision care   Keep your incision area and your bandage (dressing) clean and dry.  Follow  instructions from your health care provider about how to take care of your incision. Make sure you: ? Wash your hands with soap and water before you change your dressing. If soap and water are not available, use hand sanitizer. ? Change your dressing as told by your health care provider. ? Leave stitches (sutures), staples, skin glue, or adhesive strips in place. These skin closures may need to stay in place for 2 weeks or longer. If adhesive strip edges start to loosen and curl up, you may trim the loose edges. Do not remove adhesive strips completely unless your health care provider tells you to do that.  Check your incision area every day for signs of infection. Check for: ? Redness, swelling, or pain. ? Fluid or blood. ? Warmth. ? Pus or a bad smell. Activity   Do not drive or use heavy machinery while taking prescription pain medicine.  Do not drive for 24 hours if you received a medicine to help you relax (sedative) during your procedure.  Take frequent, short walks throughout the day. Rest when you get tired. Ask your health care provider what activities are safe for you.  Avoid activity that requires great effort. Also, avoid heavy lifting. Do not lift anything that is heavier than 10 lbs. (4.5 kg), or the limit that your health care  provider tells you, until he or she says that it is safe to do so.  Do not douche, use tampons, or have sex until your health care provider approves. General instructions   To prevent or treat constipation while you are taking prescription pain medicine, your health care provider may recommend that you: ? Drink enough fluid to keep your urine clear or pale yellow. ? Take over-the-counter or prescription medicines. ? Eat foods that are high in fiber, such as fresh fruits and vegetables, whole grains, and beans. ? Limit foods that are high in fat and processed sugars, such as fried and sweet foods.  Take over-the-counter and prescription medicines  only as told by your health care provider.  Do not take baths, swim, or use a hot tub until your health care provider approves. Ask your health care provider if you can take showers. You may only be allowed to take sponge baths for bathing.  Wear compression stockings as told by your health care provider. These stockings help to prevent blood clots and reduce swelling in your legs.  Keep all follow-up visits as told by your health care provider. This is important. Contact a health care provider if:  You have pain when you urinate.  You have pus or a bad smelling discharge coming from your vagina.  You have redness, swelling, or pain around your incision.  You have fluid or blood coming from your incision.  Your incision feels warm to the touch.  You have pus or a bad smell coming from your incision.  You have a fever.  Your incision starts to break open.  You have pain in the abdomen, and it gets worse or does not get better when you take medicine.  You develop a rash.  You develop nausea and vomiting.  You feel lightheaded. Get help right away if:  You develop pain in your chest or leg.  You become short of breath.  You faint.  You have increased bleeding from your vagina. Summary  After the procedure, it is common to have pain, bleeding in the vagina, and symptoms of menopause.  Follow instructions from your health care provider about how to take care of your incision.  Follow instructions from your health care provider about activities and restrictions.  Check your incision every day for signs of infection and report any symptoms to your health care provider. This information is not intended to replace advice given to you by your health care provider. Make sure you discuss any questions you have with your health care provider. Document Revised: 05/18/2018 Document Reviewed: 04/18/2016 Elsevier Patient Education  [place].  Abdominal Hysterectomy,  Care After This sheet gives you information about how to care for yourself after your procedure. Your doctor may also give you more specific instructions. If you have problems or questions, contact your doctor. Follow these instructions at home: Bathing  Do not take baths, swim, or use a hot tub until your doctor says it is okay. Ask your doctor if you can take showers. You may only be allowed to take sponge baths for bathing.  Keep the bandage (dressing) dry until your doctor says it can be taken off. Surgical cut (incision) care      Follow instructions from your doctor about how to take care of your cut from surgery. Make sure you: ? Wash your hands with soap and water before you change your bandage (dressing). If you cannot use soap and water, use hand sanitizer. ? Change  your bandage as told by your doctor. ? Leave stitches (sutures), skin glue, or skin tape (adhesive) strips in place. They may need to stay in place for 2 weeks or longer. If tape strips get loose and curl up, you may trim the loose edges. Do not remove tape strips completely unless your doctor says it is okay.  Check your surgical cut area every day for signs of infection. Check for: ? Redness, swelling, or pain. ? Fluid or blood. ? Warmth. ? Pus or a bad smell. Activity  Do gentle, daily exercise as told by your doctor. You may be told to take short walks every day and go farther each time.  Do not lift anything that is heavier than 10 lb (4.5 kg), or the limit that your doctor tells you, until he or she says that it is safe.  Do not drive or use heavy machinery while taking prescription pain medicine.  Do not drive for 24 hours if you were given a medicine to help you relax (sedative).  Follow your doctor's advice about exercise, driving, and general activities. Ask your doctor what activities are safe for you. Lifestyle   Do not douche, use tampons, or have sex for at least 6 weeks or as told by your  doctor.  Do not drink alcohol until your doctor says it is okay.  Drink enough fluid to keep your pee (urine) clear or pale yellow.  Try to have someone at home with you for the first 1-2 weeks to help.  Do not use any products that contain nicotine or tobacco, such as cigarettes and e-cigarettes. These can slow down healing. If you need help quitting, ask your doctor. General instructions  Take over-the-counter and prescription medicines only as told by your doctor.  Do not take aspirin or ibuprofen. These medicines can cause bleeding.  To prevent or treat constipation while you are taking prescription pain medicine, your doctor may suggest that you: ? Drink enough fluid to keep your urine clear or pale yellow. ? Take over-the-counter or prescription medicines. ? Eat foods that are high in fiber, such as:  Fresh fruits and vegetables.  Whole grains.  Beans. ? Limit foods that are high in fat and processed sugars, such as fried and sweet foods.  Keep all follow-up visits as told by your doctor. This is important. Contact a doctor if:  You have chills or fever.  You have redness, swelling, or pain around your cut.  You have fluid or blood coming from your cut.  Your cut feels warm to the touch.  You have pus or a bad smell coming from your cut.  Your cut breaks open.  You feel dizzy or light-headed.  You have pain or bleeding when you pee.  You keep having watery poop (diarrhea).  You keep feeling sick to your stomach (nauseous) or keep throwing up (vomiting).  You have unusual fluid (discharge) coming from your vagina.  You have a rash.  You have a reaction to your medicine.  Your pain medicine does not help. Get help right away if:  You have a fever and your symptoms get worse all of a sudden.  You have very bad belly (abdominal) pain.  You are short of breath.  You pass out (faint).  You have pain, swelling, or redness of your leg.  You bleed a  lot from your vagina and notice clumps of blood (clots). Summary  Do not take baths, swim, or use a hot tub until  your doctor says it is okay. Ask your doctor if you can take showers. You may only be allowed to take sponge baths for bathing.  Follow your doctor's advice about exercise, driving, and general activities. Ask your doctor what activities are safe for you.  Do not lift anything that is heavier than 10 lb (4.5 kg), or the limit that your doctor tells you, until he or she says that it is safe.  Try to have someone at home with you for the first 1-2 weeks to help. This information is not intended to replace advice given to you by your health care provider. Make sure you discuss any questions you have with your health care provider. Document Revised: 05/17/2018 Document Reviewed: 03/02/2016 Elsevier Patient Education  2020 [place] Anesthesia, Adult, Care After This sheet gives you information about how to care for yourself after your procedure. Your health care provider may also give you more specific instructions. If you have problems or questions, contact your health care provider. What can I expect after the procedure? After the procedure, the following side effects are common:  Pain or discomfort at the IV site.  Nausea.  Vomiting.  Sore throat.  Trouble concentrating.  Feeling cold or chills.  Weak or tired.  Sleepiness and fatigue.  Soreness and body aches. These side effects can affect parts of the body that were not involved in surgery. Follow these instructions at home:  For at least 24 hours after the procedure:  Have a responsible adult stay with you. It is important to have someone help care for you until you are awake and alert.  Rest as needed.  Do not: ? Participate in activities in which you could fall or become injured. ? Drive. ? Use heavy machinery. ? Drink alcohol. ? Take sleeping pills or medicines that cause  drowsiness. ? Make important decisions or sign legal documents. ? Take care of children on your own. Eating and drinking  Follow any instructions from your health care provider about eating or drinking restrictions.  When you feel hungry, start by eating small amounts of foods that are soft and easy to digest (bland), such as toast. Gradually return to your regular diet.  Drink enough fluid to keep your urine pale yellow.  If you vomit, rehydrate by drinking water, juice, or clear broth. General instructions  If you have sleep apnea, surgery and certain medicines can increase your risk for breathing problems. Follow instructions from your health care provider about wearing your sleep device: ? Anytime you are sleeping, including during daytime naps. ? While taking prescription pain medicines, sleeping medicines, or medicines that make you drowsy.  Return to your normal activities as told by your health care provider. Ask your health care provider what activities are safe for you.  Take over-the-counter and prescription medicines only as told by your health care provider.  If you smoke, do not smoke without supervision.  Keep all follow-up visits as told by your health care provider. This is important. Contact a health care provider if:  You have nausea or vomiting that does not get better with medicine.  You cannot eat or drink without vomiting.  You have pain that does not get better with medicine.  You are unable to pass urine.  You develop a skin rash.  You have a fever.  You have redness around your IV site that gets worse. Get help right away if:  You have difficulty breathing.  You have chest pain.  You have blood in your urine or stool, or you vomit blood. Summary  After the procedure, it is common to have a sore throat or nausea. It is also common to feel tired.  Have a responsible adult stay with you for the first 24 hours after general anesthesia. It is  important to have someone help care for you until you are awake and alert.  When you feel hungry, start by eating small amounts of foods that are soft and easy to digest (bland), such as toast. Gradually return to your regular diet.  Drink enough fluid to keep your urine pale yellow.  Return to your normal activities as told by your health care provider. Ask your health care provider what activities are safe for you. This information is not intended to replace advice given to you by your health care provider. Make sure you discuss any questions you have with your health care provider. Document Revised: 03/17/2017 Document Reviewed: 10/28/2016 Elsevier Patient Education  [place]. How to Use Chlorhexidine for Bathing Chlorhexidine gluconate (CHG) is a germ-killing (antiseptic) solution that is used to clean the skin. It can get rid of the bacteria that normally live on the skin and can keep them away for about 24 hours. To clean your skin with CHG, you may be given:  A CHG solution to use in the shower or as part of a sponge bath.  A prepackaged cloth that contains CHG. Cleaning your skin with CHG may help lower the risk for infection:  While you are staying in the intensive care unit of the hospital.  If you have a vascular access, such as a central line, to provide short-term or long-term access to your veins.  If you have a catheter to drain urine from your bladder.  If you are on a ventilator. A ventilator is a machine that helps you breathe by moving air in and out of your lungs.  After surgery. What are the risks? Risks of using CHG include:  A skin reaction.  Hearing loss, if CHG gets in your ears.  Eye injury, if CHG gets in your eyes and is not rinsed out.  The CHG product catching fire. Make sure that you avoid smoking and flames after applying CHG to your skin. Do not use CHG:  If you have a chlorhexidine allergy or have previously reacted to  chlorhexidine.  On babies younger than 28 months of age. How to use CHG solution  Use CHG only as told by your health care provider, and follow the instructions on the label.  Use the full amount of CHG as directed. Usually, this is one bottle. During a shower Follow these steps when using CHG solution during a shower (unless your health care provider gives you different instructions): 1. Start the shower. 2. Use your normal soap and shampoo to wash your face and hair. 3. Turn off the shower or move out of the shower stream. 4. Pour the CHG onto a clean washcloth. Do not use any type of brush or rough-edged sponge. 5. Starting at your neck, lather your body down to your toes. Make sure you follow these instructions: ? If you will be having surgery, pay special attention to the part of your body where you will be having surgery. Scrub this area for at least 1 minute. ? Do not use CHG on your head or face. If the solution gets into your ears or eyes, rinse them well with water. ? Avoid your genital area. ?  Avoid any areas of skin that have broken skin, cuts, or scrapes. ? Scrub your back and under your arms. Make sure to wash skin folds. 6. Let the lather sit on your skin for 1-2 minutes or as long as told by your health care provider. 7. Thoroughly rinse your entire body in the shower. Make sure that all body creases and crevices are rinsed well. 8. Dry off with a clean towel. Do not put any substances on your body afterward--such as powder, lotion, or perfume--unless you are told to do so by your health care provider. Only use lotions that are recommended by the manufacturer. 9. Put on clean clothes or pajamas. 10. If it is the night before your surgery, sleep in clean sheets.  During a sponge bath Follow these steps when using CHG solution during a sponge bath (unless your health care provider gives you different instructions): 1. Use your normal soap and shampoo to wash your face and  hair. 2. Pour the CHG onto a clean washcloth. 3. Starting at your neck, lather your body down to your toes. Make sure you follow these instructions: ? If you will be having surgery, pay special attention to the part of your body where you will be having surgery. Scrub this area for at least 1 minute. ? Do not use CHG on your head or face. If the solution gets into your ears or eyes, rinse them well with water. ? Avoid your genital area. ? Avoid any areas of skin that have broken skin, cuts, or scrapes. ? Scrub your back and under your arms. Make sure to wash skin folds. 4. Let the lather sit on your skin for 1-2 minutes or as long as told by your health care provider. 5. Using a different clean, wet washcloth, thoroughly rinse your entire body. Make sure that all body creases and crevices are rinsed well. 6. Dry off with a clean towel. Do not put any substances on your body afterward--such as powder, lotion, or perfume--unless you are told to do so by your health care provider. Only use lotions that are recommended by the manufacturer. 7. Put on clean clothes or pajamas. 8. If it is the night before your surgery, sleep in clean sheets. How to use CHG prepackaged cloths  Only use CHG cloths as told by your health care provider, and follow the instructions on the label.  Use the CHG cloth on clean, dry skin.  Do not use the CHG cloth on your head or face unless your health care provider tells you to.  When washing with the CHG cloth: ? Avoid your genital area. ? Avoid any areas of skin that have broken skin, cuts, or scrapes. Before surgery Follow these steps when using a CHG cloth to clean before surgery (unless your health care provider gives you different instructions): 1. Using the CHG cloth, vigorously scrub the part of your body where you will be having surgery. Scrub using a back-and-forth motion for 3 minutes. The area on your body should be completely wet with CHG when you are done  scrubbing. 2. Do not rinse. Discard the cloth and let the area air-dry. Do not put any substances on the area afterward, such as powder, lotion, or perfume. 3. Put on clean clothes or pajamas. 4. If it is the night before your surgery, sleep in clean sheets.  For general bathing Follow these steps when using CHG cloths for general bathing (unless your health care provider gives you different instructions). 1.  Use a separate CHG cloth for each area of your body. Make sure you wash between any folds of skin and between your fingers and toes. Wash your body in the following order, switching to a new cloth after each step: ? The front of your neck, shoulders, and chest. ? Both of your arms, under your arms, and your hands. ? Your stomach and groin area, avoiding the genitals. ? Your right leg and foot. ? Your left leg and foot. ? The back of your neck, your back, and your buttocks. 2. Do not rinse. Discard the cloth and let the area air-dry. Do not put any substances on your body afterward--such as powder, lotion, or perfume--unless you are told to do so by your health care provider. Only use lotions that are recommended by the manufacturer. 3. Put on clean clothes or pajamas. Contact a health care provider if:  Your skin gets irritated after scrubbing.  You have questions about using your solution or cloth. Get help right away if:  Your eyes become very red or swollen.  Your eyes itch badly.  Your skin itches badly and is red or swollen.  Your hearing changes.  You have trouble seeing.  You have swelling or tingling in your mouth or throat.  You have trouble breathing.  You swallow any chlorhexidine. Summary  Chlorhexidine gluconate (CHG) is a germ-killing (antiseptic) solution that is used to clean the skin. Cleaning your skin with CHG may help to lower your risk for infection.  You may be given CHG to use for bathing. It may be in a bottle or in a prepackaged cloth to use on  your skin. Carefully follow your health care provider's instructions and the instructions on the product label.  Do not use CHG if you have a chlorhexidine allergy.  Contact your health care provider if your skin gets irritated after scrubbing. This information is not intended to replace advice given to you by your health care provider. Make sure you discuss any questions you have with your health care provider. Document Revised: 05/31/2018 Document Reviewed: 02/09/2017 Elsevier Patient Education  [place].

## 2019-09-17 NOTE — H&P (Signed)
Preoperative History and Physical  Amanda Morrow is a 49 y.o. G1P1001 with No LMP recorded. admitted for a TAH BSO.   Pt with fibroids 20 weeks size Has heavy long periods Last 2 weeks Was transfused last August 2 units due to anemia  [facility] R Normal Pap Normal EMBx Wears tampons and pads together Soils clothes sheets Double pads at night Sleeps on a towel  PMH:    Past Medical History:  Diagnosis Date  . Anemia   . Gastroesophageal reflux   . Hypercholesterolemia   . Hypertension     PSH:     Past Surgical History:  Procedure Laterality Date  . APPENDECTOMY  2004  . CESAREAN SECTION      POb/GynH:      OB History    Gravida  1   Para  1   Term  1   Preterm      AB      Living  1     SAB      TAB      Ectopic      Multiple      Live Births  1           SH:   Social History   Tobacco Use  . Smoking status: Former Smoker    Packs/day: 0.75    Years: 25.00    Pack years: 18.75    Types: Cigarettes    Quit date: 08/27/2019    Years since quitting: 0.0  . Smokeless tobacco: Never Used  Vaping Use  . Vaping Use: Never used  Substance Use Topics  . Alcohol use: Yes    Comment: occasionally  . Drug use: Not Currently    FH:    Family History  Problem Relation Age of Onset  . Hypertension Mother   . Diabetes Mother   . Cancer Mother   . Diabetes Father   . Hypertension Father   . Cancer Father      Allergies:  Allergies  Allergen Reactions  . Morphine And Related     "makes me violently sick"     Medications:      No current facility-administered medications for this encounter.  Current Outpatient Medications:  .  amLODipine (NORVASC) 10 MG tablet, TAKE ONE TABLET BY MOUTH DAILY. (Patient taking differently: Take 10 mg by mouth daily. ), Disp: 90 tablet, Rfl: 1 .  Biotin 800 MCG TABS, Take 800 mcg by mouth daily., Disp: , Rfl:  .  Ferrous Sulfate 27 MG TABS, Take 27 mg by mouth daily., Disp: , Rfl:  .  hydrochlorothiazide  (HYDRODIURIL) 25 MG tablet, Take 25 mg by mouth daily., Disp: , Rfl:  .  megestrol (MEGACE) 40 MG tablet, 3 tablets a day for 5 days, 2 tablets a day for 5 days then 1 tablet daily (Patient taking differently: Take 40 mg by mouth daily. ), Disp: 45 tablet, Rfl: 3 .  Multiple Vitamins-Minerals (WOMENS MULTIVITAMIN) TABS, Take 1 tablet by mouth daily., Disp: , Rfl:  .  nitroGLYCERIN (NITROSTAT) 0.4 MG SL tablet, Place 0.4 mg under the tongue every 5 (five) minutes as needed for chest pain., Disp: , Rfl:  .  Potassium 99 MG TABS, Take 99 mg by mouth daily., Disp: , Rfl:  .  potassium chloride (KLOR-CON) 20 MEQ packet, Take 20 mEq by mouth daily., Disp: , Rfl:   Review of Systems:   Review of Systems  Constitutional: Negative for fever, chills, weight loss, malaise/fatigue and diaphoresis.  HENT: Negative for hearing loss, ear pain, nosebleeds, congestion, sore throat, neck pain, tinnitus and ear discharge.   Eyes: Negative for blurred vision, double vision, photophobia, pain, discharge and redness.  Respiratory: Negative for cough, hemoptysis, sputum production, shortness of breath, wheezing and stridor.   Cardiovascular: Negative for chest pain, palpitations, orthopnea, claudication, leg swelling and PND.  Gastrointestinal: Positive for abdominal pain. Negative for heartburn, nausea, vomiting, diarrhea, constipation, blood in stool and melena.  Genitourinary: Negative for dysuria, urgency, frequency, hematuria and flank pain.  Musculoskeletal: Negative for myalgias, back pain, joint pain and falls.  Skin: Negative for itching and rash.  Neurological: Negative for dizziness, tingling, tremors, sensory change, speech change, focal weakness, seizures, loss of consciousness, weakness and headaches.  Endo/Heme/Allergies: Negative for environmental allergies and polydipsia. Does not bruise/bleed easily.  Psychiatric/Behavioral: Negative for depression, suicidal ideas, hallucinations, memory loss and  substance abuse. The patient is not nervous/anxious and does not have insomnia.      PHYSICAL EXAM:  There were no vitals taken for this visit.    Vitals reviewed. Constitutional: She is oriented to person, place, and time. She appears well-developed and well-nourished.  HENT:  Head: Normocephalic and atraumatic.  Right Ear: External ear normal.  Left Ear: External ear normal.  Nose: Nose normal.  Mouth/Throat: Oropharynx is clear and moist.  Eyes: Conjunctivae and EOM are normal. Pupils are equal, round, and reactive to light. Right eye exhibits no discharge. Left eye exhibits no discharge. No scleral icterus.  Neck: Normal range of motion. Neck supple. No tracheal deviation present. No thyromegaly present.  Cardiovascular: Normal rate, regular rhythm, normal heart sounds and intact distal pulses.  Exam reveals no gallop and no friction rub.   No murmur heard. Respiratory: Effort normal and breath sounds normal. No respiratory distress. She has no wheezes. She has no rales. She exhibits no tenderness.  GI: Soft. Bowel sounds are normal. She exhibits no distension and no mass. There is tenderness. There is no rebound and no guarding.  Genitourinary:       Vulva is normal without lesions Vagina is pink moist without discharge Cervix normal in appearance and pap is normal Uterus is 20 weeks size multiple fibroids Adnexa is negative with normal sized ovaries by sonogram  Musculoskeletal: Normal range of motion. She exhibits no edema and no tenderness.  Neurological: She is alert and oriented to person, place, and time. She has normal reflexes. She displays normal reflexes. No cranial nerve deficit. She exhibits normal muscle tone. Coordination normal.  Skin: Skin is warm and dry. No rash noted. No erythema. No pallor.  Psychiatric: She has a normal mood and affect. Her behavior is normal. Judgment and thought content normal.    Labs: Results for orders placed or performed during the  hospital encounter of 09/16/19 (from the past 336 hour(s))  SARS CORONAVIRUS 2 (TAT 6-24 HRS) Nasopharyngeal Nasopharyngeal Swab   Collection Time: 09/16/19 10:03 AM   Specimen: Nasopharyngeal Swab  Result Value Ref Range   SARS Coronavirus 2 NEGATIVE NEGATIVE  CBC   Collection Time: 09/16/19 10:40 AM  Result Value Ref Range   WBC 8.4 4.0 - 10.5 K/uL   RBC 4.78 3.87 - 5.11 MIL/uL   Hemoglobin 14.9 12.0 - 15.0 g/dL   HCT 43.7 36 - 46 %   MCV 91.4 80.0 - 100.0 fL   MCH 31.2 26.0 - 34.0 pg   MCHC 34.1 30.0 - 36.0 g/dL   RDW 12.6 11.5 - 15.5 %   Platelets 337  150 - 400 K/uL   nRBC 0.0 0.0 - 0.2 %  Comprehensive metabolic panel   Collection Time: 09/16/19 10:40 AM  Result Value Ref Range   Sodium 136 135 - 145 mmol/L   Potassium 3.3 (L) 3.5 - 5.1 mmol/L   Chloride 101 98 - 111 mmol/L   CO2 24 22 - 32 mmol/L   Glucose, Bld 108 (H) 70 - 99 mg/dL   BUN 12 6 - 20 mg/dL   Creatinine, Ser 0.80 0.44 - 1.00 mg/dL   Calcium 9.9 8.9 - 10.3 mg/dL   Total Protein 8.1 6.5 - 8.1 g/dL   Albumin 4.5 3.5 - 5.0 g/dL   AST 13 (L) 15 - 41 U/L   ALT 25 0 - 44 U/L   Alkaline Phosphatase 45 38 - 126 U/L   Total Bilirubin 0.6 0.3 - 1.2 mg/dL   GFR calc non Af Amer >60 >60 mL/min   GFR calc Af Amer >60 >60 mL/min   Anion gap 11 5 - 15  hCG, quantitative, pregnancy   Collection Time: 09/16/19 10:40 AM  Result Value Ref Range   hCG, Beta Chain, Quant, S <1 <5 mIU/mL  Rapid HIV screen (HIV 1/2 Ab+Ag)   Collection Time: 09/16/19 10:40 AM  Result Value Ref Range   HIV-1 P24 Antigen - HIV24 NON REACTIVE NON REACTIVE   HIV 1/2 Antibodies NON REACTIVE NON REACTIVE   Interpretation (HIV Ag Ab)      A non reactive test result means that HIV 1 or HIV 2 antibodies and HIV 1 p24 antigen were not detected in the specimen.  Type and screen   Collection Time: 09/16/19 10:40 AM  Result Value Ref Range   ABO/RH(D) B POS    Antibody Screen NEG    Sample Expiration 09/30/2019,2359    Extend sample reason       NO TRANSFUSIONS OR PREGNANCY IN THE PAST 3 MONTHS Performed at [facility], [address], [place], [place] [address]   Urinalysis, Routine w reflex microscopic   Collection Time: 09/16/19 10:41 AM  Result Value Ref Range   Color, Urine YELLOW YELLOW   APPearance CLEAR CLEAR   Specific Gravity, Urine 1.013 1.005 - 1.030   pH 6.0 5.0 - 8.0   Glucose, UA NEGATIVE NEGATIVE mg/dL   Hgb urine dipstick SMALL (A) NEGATIVE   Bilirubin Urine NEGATIVE NEGATIVE   Ketones, ur NEGATIVE NEGATIVE mg/dL   Protein, ur NEGATIVE NEGATIVE mg/dL   Nitrite NEGATIVE NEGATIVE   Leukocytes,Ua NEGATIVE NEGATIVE   RBC / HPF 0-5 0 - 5 RBC/hpf   WBC, UA 0-5 0 - 5 WBC/hpf   Bacteria, UA NONE SEEN NONE SEEN   Squamous Epithelial / LPF 0-5 0 - 5   Mucus PRESENT     EKG: Orders placed or performed in visit on 01/08/19  . EKG    Imaging Studies: No results found.    Assessment: Fibroids, 20 weeks size Menometrorrhagia dysmenorrhea   Plan: TAH BSO planned  Pt understands the risks of surgery including but not limited t  excessive bleeding requiring transfusion or reoperation, post-operative infection requiring prolonged hospitalization or re-hospitalization and antibiotic therapy, and damage to other organs including bladder, bowel, ureters and major vessels.  The patient also understands the alternative treatment options which were discussed in full.  All questions were answered.  Amanda Morrow 09/17/2019 2:30 PM   Amanda Morrow 09/17/2019 2:30 PM

## 2019-09-18 ENCOUNTER — Other Ambulatory Visit: Payer: Self-pay

## 2019-09-18 ENCOUNTER — Inpatient Hospital Stay (HOSPITAL_COMMUNITY)
Admission: AD | Admit: 2019-09-18 | Discharge: 2019-09-19 | DRG: 743 | Disposition: A | Discharge status: Home / Self Care | Payer: Self-pay | Source: Ambulatory Visit | Attending: Obstetrics & Gynecology | Admitting: Obstetrics & Gynecology

## 2019-09-18 ENCOUNTER — Encounter (HOSPITAL_COMMUNITY)
Admission: AD | Disposition: A | Discharge status: Home / Self Care | Payer: Self-pay | Source: Ambulatory Visit | Attending: Obstetrics & Gynecology

## 2019-09-18 ENCOUNTER — Encounter (HOSPITAL_COMMUNITY): Payer: Self-pay | Admitting: Obstetrics & Gynecology

## 2019-09-18 ENCOUNTER — Inpatient Hospital Stay (HOSPITAL_COMMUNITY): Payer: Self-pay | Admitting: Anesthesiology

## 2019-09-18 DIAGNOSIS — D259 Leiomyoma of uterus, unspecified: Principal | ICD-10-CM

## 2019-09-18 DIAGNOSIS — Z885 Allergy status to narcotic agent status: Secondary | ICD-10-CM

## 2019-09-18 DIAGNOSIS — N946 Dysmenorrhea, unspecified: Secondary | ICD-10-CM

## 2019-09-18 DIAGNOSIS — Z7289 Other problems related to lifestyle: Secondary | ICD-10-CM

## 2019-09-18 DIAGNOSIS — Z8249 Family history of ischemic heart disease and other diseases of the circulatory system: Secondary | ICD-10-CM

## 2019-09-18 DIAGNOSIS — Z79899 Other long term (current) drug therapy: Secondary | ICD-10-CM

## 2019-09-18 DIAGNOSIS — Z90710 Acquired absence of both cervix and uterus: Secondary | ICD-10-CM | POA: Diagnosis present

## 2019-09-18 DIAGNOSIS — K219 Gastro-esophageal reflux disease without esophagitis: Secondary | ICD-10-CM | POA: Diagnosis present

## 2019-09-18 DIAGNOSIS — N921 Excessive and frequent menstruation with irregular cycle: Secondary | ICD-10-CM

## 2019-09-18 DIAGNOSIS — I10 Essential (primary) hypertension: Secondary | ICD-10-CM | POA: Diagnosis present

## 2019-09-18 DIAGNOSIS — E7800 Pure hypercholesterolemia, unspecified: Secondary | ICD-10-CM | POA: Diagnosis present

## 2019-09-18 DIAGNOSIS — Z20822 Contact with and (suspected) exposure to covid-19: Secondary | ICD-10-CM | POA: Diagnosis present

## 2019-09-18 DIAGNOSIS — Z87891 Personal history of nicotine dependence: Secondary | ICD-10-CM

## 2019-09-18 HISTORY — PX: HYSTERECTOMY ABDOMINAL WITH SALPINGO-OOPHORECTOMY: SHX6792

## 2019-09-18 LAB — PREPARE RBC (CROSSMATCH)

## 2019-09-18 LAB — ABO/RH: ABO/RH(D): B POS

## 2019-09-18 SURGERY — HYSTERECTOMY, ABDOMINAL, WITH SALPINGO-OOPHORECTOMY
Anesthesia: General | Site: Abdomen

## 2019-09-18 MED ORDER — SODIUM CHLORIDE 0.9 % IR SOLN
Status: DC | PRN
  Administered 2019-09-18: 1000 mL

## 2019-09-18 MED ORDER — SODIUM CHLORIDE (PF) 0.9 % IJ SOLN
INTRAMUSCULAR | Status: AC
  Filled 2019-09-18: qty 20

## 2019-09-18 MED ORDER — FENTANYL CITRATE (PF) 100 MCG/2ML IJ SOLN
INTRAMUSCULAR | Status: DC | PRN
  Administered 2019-09-18 (×4): 50 ug via INTRAVENOUS
  Administered 2019-09-18: 100 ug via INTRAVENOUS
  Administered 2019-09-18 (×2): 50 ug via INTRAVENOUS
  Administered 2019-09-18: 100 ug via INTRAVENOUS

## 2019-09-18 MED ORDER — CEFAZOLIN SODIUM-DEXTROSE 2-4 GM/100ML-% IV SOLN
INTRAVENOUS | Status: AC
  Filled 2019-09-18: qty 100

## 2019-09-18 MED ORDER — ONDANSETRON HCL 4 MG PO TABS
8.0000 mg | ORAL_TABLET | Freq: Four times a day (QID) | ORAL | Status: DC | PRN
  Filled 2019-09-18: qty 1

## 2019-09-18 MED ORDER — LACTATED RINGERS IV SOLN: INTRAVENOUS | Status: DC | PRN

## 2019-09-18 MED ORDER — KETOROLAC TROMETHAMINE 30 MG/ML IJ SOLN
30.0000 mg | Freq: Once | INTRAMUSCULAR | Status: AC
  Administered 2019-09-18: 30 mg via INTRAVENOUS
  Filled 2019-09-18: qty 1

## 2019-09-18 MED ORDER — HYDROMORPHONE HCL 1 MG/ML IJ SOLN: 0.2500 mg | INTRAMUSCULAR | Status: DC | PRN

## 2019-09-18 MED ORDER — CEFAZOLIN SODIUM-DEXTROSE 2-4 GM/100ML-% IV SOLN
2.0000 g | INTRAVENOUS | Status: AC
  Administered 2019-09-18 (×2): 2 g via INTRAVENOUS
  Filled 2019-09-18: qty 100

## 2019-09-18 MED ORDER — DEXAMETHASONE SODIUM PHOSPHATE 4 MG/ML IJ SOLN
INTRAMUSCULAR | Status: DC | PRN
  Administered 2019-09-18: 10 mg via INTRAVENOUS

## 2019-09-18 MED ORDER — KCL IN DEXTROSE-NACL 20-5-0.45 MEQ/L-%-% IV SOLN
INTRAVENOUS | Status: AC
  Filled 2019-09-18 (×2): qty 1000

## 2019-09-18 MED ORDER — ALBUMIN HUMAN 25 % IV SOLN: INTRAVENOUS | Status: DC | PRN

## 2019-09-18 MED ORDER — AMLODIPINE BESYLATE 5 MG PO TABS
10.0000 mg | ORAL_TABLET | Freq: Every day | ORAL | Status: DC
  Administered 2019-09-19: 10 mg via ORAL
  Filled 2019-09-18: qty 2
  Filled 2019-09-18 (×2): qty 1

## 2019-09-18 MED ORDER — KETOROLAC TROMETHAMINE 30 MG/ML IJ SOLN
30.0000 mg | Freq: Four times a day (QID) | INTRAMUSCULAR | Status: DC
  Administered 2019-09-18 – 2019-09-19 (×3): 30 mg via INTRAVENOUS
  Filled 2019-09-18 (×3): qty 1

## 2019-09-18 MED ORDER — PROPOFOL 10 MG/ML IV BOLUS
INTRAVENOUS | Status: AC
  Filled 2019-09-18: qty 40

## 2019-09-18 MED ORDER — SEVOFLURANE IN SOLN
RESPIRATORY_TRACT | Status: AC
  Filled 2019-09-18: qty 250

## 2019-09-18 MED ORDER — ENOXAPARIN SODIUM 40 MG/0.4ML ~~LOC~~ SOLN
40.0000 mg | SUBCUTANEOUS | Status: DC
  Administered 2019-09-19: 40 mg via SUBCUTANEOUS
  Filled 2019-09-18: qty 0.4

## 2019-09-18 MED ORDER — ROCURONIUM BROMIDE 10 MG/ML (PF) SYRINGE
PREFILLED_SYRINGE | INTRAVENOUS | Status: AC
  Filled 2019-09-18: qty 20

## 2019-09-18 MED ORDER — BUPIVACAINE LIPOSOME 1.3 % IJ SUSP
20.0000 mL | Freq: Once | INTRAMUSCULAR | Status: DC
  Filled 2019-09-18: qty 20

## 2019-09-18 MED ORDER — FENTANYL CITRATE (PF) 250 MCG/5ML IJ SOLN
INTRAMUSCULAR | Status: AC
  Filled 2019-09-18: qty 5

## 2019-09-18 MED ORDER — LACTATED RINGERS IV SOLN: Freq: Once | INTRAVENOUS | Status: AC

## 2019-09-18 MED ORDER — KETOROLAC TROMETHAMINE 30 MG/ML IJ SOLN
INTRAMUSCULAR | Status: AC
  Filled 2019-09-18: qty 1

## 2019-09-18 MED ORDER — GABAPENTIN 100 MG PO CAPS
100.0000 mg | ORAL_CAPSULE | Freq: Three times a day (TID) | ORAL | Status: DC
  Filled 2019-09-18: qty 1

## 2019-09-18 MED ORDER — ONDANSETRON HCL 4 MG/2ML IJ SOLN
INTRAMUSCULAR | Status: AC
  Filled 2019-09-18: qty 2

## 2019-09-18 MED ORDER — SENNOSIDES-DOCUSATE SODIUM 8.6-50 MG PO TABS: 1.0000 | ORAL_TABLET | Freq: Every evening | ORAL | Status: DC | PRN

## 2019-09-18 MED ORDER — DIPHENHYDRAMINE HCL 50 MG/ML IJ SOLN: 25.0000 mg | Freq: Four times a day (QID) | INTRAMUSCULAR | Status: DC | PRN

## 2019-09-18 MED ORDER — LIDOCAINE HCL (CARDIAC) PF 100 MG/5ML IV SOSY
PREFILLED_SYRINGE | INTRAVENOUS | Status: DC | PRN
  Administered 2019-09-18: 100 mg via INTRAVENOUS

## 2019-09-18 MED ORDER — SODIUM CHLORIDE 0.9 % IV SOLN
INTRAVENOUS | Status: AC
  Filled 2019-09-18: qty 50

## 2019-09-18 MED ORDER — MIDAZOLAM HCL 2 MG/2ML IJ SOLN
INTRAMUSCULAR | Status: DC | PRN
  Administered 2019-09-18: 2 mg via INTRAVENOUS

## 2019-09-18 MED ORDER — POVIDONE-IODINE 10 % EX SWAB: 2.0000 "application " | Freq: Once | CUTANEOUS | Status: DC

## 2019-09-18 MED ORDER — SODIUM CHLORIDE 0.9 % IV SOLN
8.0000 mg | Freq: Four times a day (QID) | INTRAVENOUS | Status: DC | PRN
  Filled 2019-09-18: qty 4

## 2019-09-18 MED ORDER — ALBUMIN HUMAN 25 % IV SOLN: 12.5000 g | Freq: Once | INTRAVENOUS | Status: DC

## 2019-09-18 MED ORDER — LIDOCAINE 2% (20 MG/ML) 5 ML SYRINGE
INTRAMUSCULAR | Status: AC
  Filled 2019-09-18: qty 5

## 2019-09-18 MED ORDER — ORAL CARE MOUTH RINSE: 15.0000 mL | Freq: Once | OROMUCOSAL | Status: AC

## 2019-09-18 MED ORDER — LACTATED RINGERS IV BOLUS
500.0000 mL | Freq: Once | INTRAVENOUS | Status: AC
  Administered 2019-09-18: 500 mL via INTRAVENOUS

## 2019-09-18 MED ORDER — ROCURONIUM BROMIDE 100 MG/10ML IV SOLN
INTRAVENOUS | Status: DC | PRN
  Administered 2019-09-18 (×2): 10 mg via INTRAVENOUS
  Administered 2019-09-18: 20 mg via INTRAVENOUS
  Administered 2019-09-18: 60 mg via INTRAVENOUS

## 2019-09-18 MED ORDER — MIDAZOLAM HCL 2 MG/2ML IJ SOLN
INTRAMUSCULAR | Status: AC
  Filled 2019-09-18: qty 2

## 2019-09-18 MED ORDER — SCOPOLAMINE 1 MG/3DAYS TD PT72
1.0000 | MEDICATED_PATCH | Freq: Once | TRANSDERMAL | Status: DC
  Administered 2019-09-18: 1.5 mg via TRANSDERMAL
  Filled 2019-09-18: qty 1

## 2019-09-18 MED ORDER — METOCLOPRAMIDE HCL 5 MG/ML IJ SOLN
INTRAMUSCULAR | Status: AC
  Filled 2019-09-18: qty 2

## 2019-09-18 MED ORDER — CHLORHEXIDINE GLUCONATE 0.12 % MT SOLN
15.0000 mL | Freq: Once | OROMUCOSAL | Status: AC
  Administered 2019-09-18: 15 mL via OROMUCOSAL
  Filled 2019-09-18: qty 15

## 2019-09-18 MED ORDER — BUPIVACAINE LIPOSOME 1.3 % IJ SUSP
INTRAMUSCULAR | Status: AC
  Filled 2019-09-18: qty 20

## 2019-09-18 MED ORDER — DOCUSATE SODIUM 100 MG PO CAPS
100.0000 mg | ORAL_CAPSULE | Freq: Two times a day (BID) | ORAL | Status: DC
  Filled 2019-09-18: qty 1

## 2019-09-18 MED ORDER — OXYCODONE HCL 5 MG PO TABS: 5.0000 mg | ORAL_TABLET | ORAL | Status: DC | PRN

## 2019-09-18 MED ORDER — PHENYLEPHRINE 40 MCG/ML (10ML) SYRINGE FOR IV PUSH (FOR BLOOD PRESSURE SUPPORT)
PREFILLED_SYRINGE | INTRAVENOUS | Status: AC
  Filled 2019-09-18: qty 10

## 2019-09-18 MED ORDER — PROMETHAZINE HCL 25 MG/ML IJ SOLN: 25.0000 mg | Freq: Four times a day (QID) | INTRAMUSCULAR | Status: DC | PRN

## 2019-09-18 MED ORDER — BISACODYL 10 MG RE SUPP: 10.0000 mg | Freq: Every day | RECTAL | Status: DC | PRN

## 2019-09-18 MED ORDER — METOCLOPRAMIDE HCL 5 MG/ML IJ SOLN
10.0000 mg | Freq: Once | INTRAMUSCULAR | Status: AC
  Administered 2019-09-18: 10 mg via INTRAVENOUS

## 2019-09-18 MED ORDER — IBUPROFEN 800 MG PO TABS
ORAL_TABLET | ORAL | Status: AC
  Filled 2019-09-18: qty 1

## 2019-09-18 MED ORDER — ALBUMIN HUMAN 25 % IV SOLN
INTRAVENOUS | Status: AC
  Filled 2019-09-18: qty 50

## 2019-09-18 MED ORDER — PROPOFOL 10 MG/ML IV BOLUS
INTRAVENOUS | Status: DC | PRN
  Administered 2019-09-18: 200 mg via INTRAVENOUS

## 2019-09-18 MED ORDER — SODIUM CHLORIDE 0.9 % IV SOLN
INTRAVENOUS | Status: DC | PRN
  Administered 2019-09-18: 40 mL

## 2019-09-18 MED ORDER — SODIUM CHLORIDE 0.9 % IV SOLN
INTRAVENOUS | Status: DC | PRN
Start: 2019-09-18 — End: 2019-09-18

## 2019-09-18 MED ORDER — HYDROMORPHONE HCL 1 MG/ML IJ SOLN
INTRAMUSCULAR | Status: DC | PRN
  Administered 2019-09-18 (×2): .5 mg via INTRAVENOUS

## 2019-09-18 MED ORDER — PROMETHAZINE HCL 25 MG/ML IJ SOLN
6.2500 mg | INTRAMUSCULAR | Status: DC | PRN
  Administered 2019-09-18: 12.5 mg via INTRAVENOUS
  Filled 2019-09-18: qty 1

## 2019-09-18 MED ORDER — HYDROCHLOROTHIAZIDE 25 MG PO TABS
25.0000 mg | ORAL_TABLET | Freq: Every day | ORAL | Status: DC
  Administered 2019-09-19: 25 mg via ORAL
  Filled 2019-09-18: qty 1

## 2019-09-18 MED ORDER — IBUPROFEN 800 MG PO TABS
800.0000 mg | ORAL_TABLET | Freq: Four times a day (QID) | ORAL | Status: DC
  Administered 2019-09-18: 800 mg via ORAL

## 2019-09-18 MED ORDER — SODIUM CHLORIDE (PF) 0.9 % IJ SOLN
INTRAMUSCULAR | Status: AC
  Filled 2019-09-18: qty 10

## 2019-09-18 MED ORDER — ALUM & MAG HYDROXIDE-SIMETH 200-200-20 MG/5ML PO SUSP: 30.0000 mL | ORAL | Status: DC | PRN

## 2019-09-18 MED ORDER — MEPERIDINE HCL 50 MG/ML IJ SOLN: 6.2500 mg | INTRAMUSCULAR | Status: DC | PRN

## 2019-09-18 MED ORDER — HYDROMORPHONE HCL 1 MG/ML IJ SOLN
INTRAMUSCULAR | Status: AC
  Filled 2019-09-18: qty 1.5

## 2019-09-18 MED ORDER — DEXAMETHASONE SODIUM PHOSPHATE 10 MG/ML IJ SOLN
INTRAMUSCULAR | Status: AC
  Filled 2019-09-18: qty 1

## 2019-09-18 MED ORDER — ZOLPIDEM TARTRATE 5 MG PO TABS: 5.0000 mg | ORAL_TABLET | Freq: Every evening | ORAL | Status: DC | PRN

## 2019-09-18 MED ORDER — SODIUM CHLORIDE FLUSH 0.9 % IV SOLN
INTRAVENOUS | Status: AC
  Filled 2019-09-18: qty 10

## 2019-09-18 MED ORDER — FENTANYL CITRATE (PF) 100 MCG/2ML IJ SOLN: 50.0000 ug | INTRAMUSCULAR | Status: DC | PRN

## 2019-09-18 SURGICAL SUPPLY — 48 items
APPLIER CLIP 13 LRG OPEN (CLIP)
BLADE 10 SAFETY STRL DISP (BLADE) ×6 IMPLANT
CLIP APPLIE 13 LRG OPEN (CLIP) IMPLANT
CLOTH BEACON ORANGE TIMEOUT ST (SAFETY) ×3 IMPLANT
COVER LIGHT HANDLE STERIS (MISCELLANEOUS) ×6 IMPLANT
COVER WAND RF STERILE (DRAPES) ×6 IMPLANT
DERMABOND ADVANCED (GAUZE/BANDAGES/DRESSINGS) ×4
DERMABOND ADVANCED .7 DNX12 (GAUZE/BANDAGES/DRESSINGS) ×2 IMPLANT
DRAPE WARM FLUID 44X44 (DRAPES) ×3 IMPLANT
DRSG OPSITE POSTOP 4X10 (GAUZE/BANDAGES/DRESSINGS) ×3 IMPLANT
DRSG OPSITE POSTOP 4X8 (GAUZE/BANDAGES/DRESSINGS) ×3 IMPLANT
ELECT REM PT RETURN 9FT ADLT (ELECTROSURGICAL) ×3
ELECTRODE REM PT RTRN 9FT ADLT (ELECTROSURGICAL) ×1 IMPLANT
GAUZE 4X4 16PLY RFD (DISPOSABLE) ×3 IMPLANT
GLOVE BIOGEL PI IND STRL 7.0 (GLOVE) ×4 IMPLANT
GLOVE BIOGEL PI IND STRL 8 (GLOVE) ×1 IMPLANT
GLOVE BIOGEL PI INDICATOR 7.0 (GLOVE) ×8
GLOVE BIOGEL PI INDICATOR 8 (GLOVE) ×2
GLOVE ECLIPSE 7.0 STRL STRAW (GLOVE) ×3 IMPLANT
GLOVE ECLIPSE 8.0 STRL XLNG CF (GLOVE) ×6 IMPLANT
GOWN STRL REUS W/TWL LRG LVL3 (GOWN DISPOSABLE) ×6 IMPLANT
GOWN STRL REUS W/TWL XL LVL3 (GOWN DISPOSABLE) ×3 IMPLANT
HEMOSTAT ARISTA ABSORB 3G PWDR (HEMOSTASIS) ×3 IMPLANT
INST SET MAJOR GENERAL (KITS) ×3 IMPLANT
KIT BLADEGUARD II DBL (SET/KITS/TRAYS/PACK) ×3 IMPLANT
KIT TURNOVER KIT A (KITS) ×3 IMPLANT
MANIFOLD NEPTUNE II (INSTRUMENTS) ×3 IMPLANT
NEEDLE HYPO 22GX1.5 SAFETY (NEEDLE) ×3 IMPLANT
NS IRRIG 1000ML POUR BTL (IV SOLUTION) ×6 IMPLANT
PACK MAJOR ABDOMINAL (CUSTOM PROCEDURE TRAY) ×3 IMPLANT
PAD ARMBOARD 7.5X6 YLW CONV (MISCELLANEOUS) ×3 IMPLANT
PENCIL SMOKE EVACUATOR (MISCELLANEOUS) ×3 IMPLANT
RETRACTOR WND ALEXIS-O 25 LRG (MISCELLANEOUS) ×1 IMPLANT
RTRCTR WOUND ALEXIS O 25CM LRG (MISCELLANEOUS) ×3
SET BASIN LINEN APH (SET/KITS/TRAYS/PACK) ×3 IMPLANT
SPONGE LAP 18X18 RF (DISPOSABLE) ×12 IMPLANT
SUT CHROMIC 0 CT 1 (SUTURE) ×3 IMPLANT
SUT MNCRL+ AB 3-0 CT1 36 (SUTURE) ×2 IMPLANT
SUT MON AB 3-0 SH 27 (SUTURE) ×3 IMPLANT
SUT MONOCRYL AB 3-0 CT1 36IN (SUTURE) ×4
SUT VIC AB 0 CT1 27 (SUTURE) ×6
SUT VIC AB 0 CT1 27XCR 8 STRN (SUTURE) ×3 IMPLANT
SUT VIC AB 0 CTX 36 (SUTURE) ×4
SUT VIC AB 0 CTX36XBRD ANTBCTR (SUTURE) ×2 IMPLANT
SUT VICRYL 3 0 (SUTURE) ×3 IMPLANT
SYR 20ML LL LF (SYRINGE) ×3 IMPLANT
TOWEL SURG RFD BLUE STRL DISP (DISPOSABLE) ×3 IMPLANT
TRAY FOLEY MTR SLVR 16FR STAT (SET/KITS/TRAYS/PACK) ×3 IMPLANT

## 2019-09-18 NOTE — Op Note (Signed)
Preoperative diagnosis:  1.  26-week size fibroid uterus                                         2.  Menometrorrhagia                                         3.  Dysmenorrhea                                         4.  Anemia requiring transfusion last year                                          5.  Successful management with megestrol therapy  Postoperative diagnosis:  Same as above   Procedure:  Abdominal hysterectomy, total, with removal of both tubes and ovaries  Surgeon:  Florian Buff  Assistant:    Anesthesia:  General endotracheal  Preoperative clinical summary:    Intraoperative findings: 26-week size fibroid uterus completely occupy in the pelvis and abdominal cavity from sidewall to sidewall.  No opportunity to get to the uterine blood supply except through uterine myoma decompression.  Severe calcification  Description of operation:  Patient was taken to the operating room and placed in the supine position where she underwent general endotracheal anesthesia.  She was then prepped and draped in the usual sterile fashion and a Foley catheter was placed for continuous bladder drainage.  A Pfannenstiel skin incision was made and carried down sharply to the rectus fascia which was scored in the midline and extended laterally.  The fascia was taken off the muscles superiorly and inferiorly without difficulty.  The muscles were divided.  The peritoneal cavity was entered.  An large Alexis self-retaining retractor was placed.  The upper abdomen was packed away.   I was unable in any conservative manner to get to any blood supply either the utero-ovarian communication with the ovarian blood supply or the uterine blood supply laterally. The uterus fibroids completely occupied the pelvis and abdominal cavity from sidewall to sidewall and anteriorly posteriorly. There was absolutely no conservative way to deliver through the abdominal wall. I could not twist it and gain access to the blood  vessels anywhere I used the electrocautery unit and the knife to debulk the mass However it was so heavily calcified I could not make the mass smaller by folding each side together I was completely stuck with having to do essentially a complete uterine excision within the serosa of the uterus before I could safely gain access to the uterine blood supply again either with this communication with the ovarian vasculature or the uterine vessels laterally. Unfortunately this resulted in a large amount of blood loss but I was able to do it anatomically safely and avoid injury to the retroperitoneal structures namely the ureters. I remove the fibroid uterine mass and a probably 6 or 7 different pieces all of which were sent the pathology.  All I was left with was basically the uterine serosa at this point.   The uterine vessels were skeletonized bilaterally.  The uterine vessels  were clamped bilaterally,  then cut and suture ligated.  Two more pedicles were taken down the cervix medial to the uterine vessels.  Each pedicle was clamped cut and suture ligated with good resulting hemostasis.  The vagina was open at this point and it was crossclamped the uterus and cervix were removed Vaginal angle sutures were placed bilaterally in the vagina was closed with interrupted figure-of-eight sutures I had to oversew the vaginal angle sutures because of bleeding The infundibulopelvic ligaments were then crossclamped bilaterally and both tubes and ovaries were removed 4 and aft suture ligation bilaterally was performed with good hemostasis I took a large amount of time evaluating all pedicles Reinforcing suturing any areas that were oozing Arista was used for peritoneal oozing but there was no significant bleeding Estimated blood loss for the procedure was 2300 cc and again I had absolutely no access to any significant vascular supply to this large mass until after had completely debulked   the pelvis was irrigated  vigorously and all pedicles were examined and found to be hemostatic.  Both Fallpoian tubes were removed by cross clamping with a Kelley clamp and a fore and aft 3-0 monocryl suture for hemostasis.  All specimens were sent to pathology for routine evaluation.  The Alexis self-retaining retractor was removed and the pelvis was irrigated vigorously.  All packs were removed and all counts were correct at this point x 3.  The muscles and peritoneum were reapproximated loosely.  The fascia was closed with 0 Vicryl running.  The subcutaneous tissue was reapproximated using 2-0 plain gut.  The skin was closed using 3-0 Vicryl on a Keith needle in a subcuticular fashion.  Dermabond was then applied for additional wound integrity and to serve as a postoperative bacterial barrier.  The patient was awakened from anesthesia taken to the recovery room in good stable condition. All sponge instrument and needle counts were correct x 3.  The patient received Ancef and Toradol prophylactically preoperatively.  Estimated blood loss for the procedure was 2300  cc.  Florian Buff, MD  09/18/2019 10:57 AM

## 2019-09-18 NOTE — Anesthesia Postprocedure Evaluation (Signed)
Anesthesia Post Note  Patient: Amanda Morrow  Procedure(s) Performed: TOTAL ABDOMINAL HYSTERECTOMY WITH BILATERAL SALPINGO-OOPHORECTOMY (Abdomen)  Patient location during evaluation: PACU Anesthesia Type: General Level of consciousness: awake and patient cooperative Pain management: satisfactory to patient Vital Signs Assessment: post-procedure vital signs reviewed and stable Respiratory status: spontaneous breathing Cardiovascular status: stable Postop Assessment: no apparent nausea or vomiting Anesthetic complications: no   No complications documented.   Last Vitals:  Vitals:   09/18/19 1145 09/18/19 1200  BP: 124/74 111/69  Pulse: 92   Resp: 14 12  Temp:    SpO2: 98% 97%    Last Pain:  Vitals:   09/18/19 1200  TempSrc:   PainSc: Asleep                 Wynn Alldredge

## 2019-09-18 NOTE — Transfer of Care (Signed)
Immediate Anesthesia Transfer of Care Note  Patient: Amanda Morrow  Procedure(s) Performed: TOTAL ABDOMINAL HYSTERECTOMY WITH BILATERAL SALPINGO-OOPHORECTOMY (Abdomen)  Patient Location: PACU  Anesthesia Type:General  Level of Consciousness: awake, alert  and oriented  Airway & Oxygen Therapy: Patient Spontanous Breathing  Post-op Assessment: Report given to RN and Post -op Vital signs reviewed and stable  Post vital signs: Reviewed and stable  Last Vitals:  Vitals Value Taken Time  BP 150/61 09/18/19 1115  Temp 36.6 C 09/18/19 1107  Pulse 74 09/18/19 1123  Resp 10 09/18/19 1123  SpO2 98 % 09/18/19 1123  Vitals shown include unvalidated device data.  Last Pain:  Vitals:   09/18/19 1107  TempSrc:   PainSc: 0-No pain      Patients Stated Pain Goal: 7 (43/14/27 6701)  Complications: No complications documented.

## 2019-09-18 NOTE — Anesthesia Procedure Notes (Signed)
Procedure Name: Intubation Date/Time: 09/18/2019 8:26 AM Performed by: Hewitt Blade, CRNA Pre-anesthesia Checklist: Patient identified, Emergency Drugs available, Suction available and Patient being monitored Patient Re-evaluated:Patient Re-evaluated prior to induction Oxygen Delivery Method: Circle system utilized Preoxygenation: Pre-oxygenation with 100% oxygen Induction Type: IV induction Ventilation: Mask ventilation without difficulty Laryngoscope Size: Mac and 3 Grade View: Grade I Tube type: Oral Number of attempts: 1 Airway Equipment and Method: Stylet and Oral airway Placement Confirmation: ETT inserted through vocal cords under direct vision,  positive ETCO2 and breath sounds checked- equal and bilateral Secured at: 21 cm Tube secured with: Tape Dental Injury: Teeth and Oropharynx as per pre-operative assessment

## 2019-09-18 NOTE — Addendum Note (Signed)
Addendum  created 09/18/19 1648 by Denese Killings, MD   Order list changed

## 2019-09-18 NOTE — Interval H&P Note (Signed)
History and Physical Interval Note:  09/18/2019 8:15 AM  Amanda Morrow  has presented today for surgery, with the diagnosis of fibroids, menometorrhagia.  The various methods of treatment have been discussed with the patient and family. After consideration of risks, benefits and other options for treatment, the patient has consented to  Procedure(s): TOTAL ABDOMINAL HYSTERECTOMY WITH BILATERAL SALPINGO-OOPHORECTOMY as a surgical intervention.  The patient's history has been reviewed, patient examined, no change in status, stable for surgery.  I have reviewed the patient's chart and labs.  Questions were answered to the patient's satisfaction.     Florian Buff

## 2019-09-18 NOTE — Anesthesia Postprocedure Evaluation (Signed)
Anesthesia Post Note  Patient: Ayelen Sciortino  Procedure(s) Performed: TOTAL ABDOMINAL HYSTERECTOMY WITH BILATERAL SALPINGO-OOPHORECTOMY (Abdomen)  Patient location during evaluation: PACU Anesthesia Type: General Level of consciousness: awake and alert Pain management: pain level controlled Vital Signs Assessment: post-procedure vital signs reviewed and stable Respiratory status: spontaneous breathing, nonlabored ventilation and respiratory function stable Cardiovascular status: stable Postop Assessment: no apparent nausea or vomiting Anesthetic complications: no   No complications documented.   Last Vitals:  Vitals:   09/18/19 1145 09/18/19 1200  BP: 124/74 111/69  Pulse: 92   Resp: 14 12  Temp:    SpO2: 98% 97%    Last Pain:  Vitals:   09/18/19 1200  TempSrc:   PainSc: Asleep                 Preet Perrier Hristova

## 2019-09-18 NOTE — Anesthesia Preprocedure Evaluation (Signed)
Anesthesia Evaluation  Patient identified by MRN, date of birth, ID band Patient awake    Reviewed: Allergy & Precautions, NPO status , Patient's Chart, lab work & pertinent test results  History of Anesthesia Complications Negative for: history of anesthetic complications  Airway Mallampati: II  TM Distance: >3 FB Neck ROM: Full    Dental  (+) Missing, Dental Advisory Given   Pulmonary former smoker,    Pulmonary exam normal breath sounds clear to auscultation       Cardiovascular Exercise Tolerance: Good hypertension, Pt. on medications Normal cardiovascular exam Rhythm:Regular Rate:Normal  EKG - NSR, ST, T wave abnrmalities   Neuro/Psych negative psych ROS   GI/Hepatic Neg liver ROS, GERD  Controlled,  Endo/Other  negative endocrine ROS  Renal/GU negative Renal ROS  negative genitourinary   Musculoskeletal negative musculoskeletal ROS (+)   Abdominal   Peds negative pediatric ROS (+)  Hematology  (+) anemia ,   Anesthesia Other Findings   Reproductive/Obstetrics negative OB ROS                            Anesthesia Physical Anesthesia Plan  ASA: II  Anesthesia Plan: General   Post-op Pain Management:    Induction: Intravenous  PONV Risk Score and Plan: 4 or greater and Ondansetron, Dexamethasone, Midazolam and Scopolamine patch - Pre-op  Airway Management Planned: Oral ETT  Additional Equipment:   Intra-op Plan:   Post-operative Plan: Extubation in OR  Informed Consent: I have reviewed the patients History and Physical, chart, labs and discussed the procedure including the risks, benefits and alternatives for the proposed anesthesia with the patient or authorized representative who has indicated his/her understanding and acceptance.     Dental advisory given  Plan Discussed with: CRNA and Surgeon  Anesthesia Plan Comments:        Anesthesia Quick  Evaluation

## 2019-09-19 ENCOUNTER — Other Ambulatory Visit: Payer: Self-pay | Admitting: Obstetrics & Gynecology

## 2019-09-19 ENCOUNTER — Encounter (HOSPITAL_COMMUNITY): Payer: Self-pay | Admitting: Obstetrics & Gynecology

## 2019-09-19 LAB — BASIC METABOLIC PANEL
Anion gap: 10 (ref 5–15)
BUN: 10 mg/dL (ref 6–20)
CO2: 21 mmol/L — ABNORMAL LOW (ref 22–32)
Calcium: 8.4 mg/dL — ABNORMAL LOW (ref 8.9–10.3)
Chloride: 105 mmol/L (ref 98–111)
Creatinine, Ser: 0.81 mg/dL (ref 0.44–1.00)
GFR calc Af Amer: >60 mL/min (ref >60)
GFR calc non Af Amer: >60 mL/min (ref >60)
Glucose, Bld: 134 mg/dL — ABNORMAL HIGH (ref 70–99)
Potassium: 3.2 mmol/L — ABNORMAL LOW (ref 3.5–5.1)
Sodium: 136 mmol/L (ref 135–145)

## 2019-09-19 LAB — CBC
HCT: 25.8 % — ABNORMAL LOW (ref 36.0–46.0)
Hemoglobin: 8.8 g/dL — ABNORMAL LOW (ref 12.0–15.0)
MCH: 31.3 pg (ref 26.0–34.0)
MCHC: 34.1 g/dL (ref 30.0–36.0)
MCV: 91.8 fL (ref 80.0–100.0)
Platelets: 268 10*3/uL (ref 150–400)
RBC: 2.81 MIL/uL — ABNORMAL LOW (ref 3.87–5.11)
RDW: 13 % (ref 11.5–15.5)
WBC: 13.6 10*3/uL — ABNORMAL HIGH (ref 4.0–10.5)
nRBC: 0 % (ref 0.0–0.2)

## 2019-09-19 MED ORDER — KETOROLAC TROMETHAMINE 10 MG PO TABS
10.0000 mg | ORAL_TABLET | Freq: Three times a day (TID) | ORAL | 0 refills | Status: DC | PRN
Start: 2019-09-19 — End: 2020-08-18

## 2019-09-19 MED ORDER — ONDANSETRON HCL 8 MG PO TABS: 8.0000 mg | ORAL_TABLET | Freq: Four times a day (QID) | ORAL | 0 refills | Status: DC | PRN

## 2019-09-19 MED ORDER — OXYCODONE-ACETAMINOPHEN 5-325 MG PO TABS: 1.0000 | ORAL_TABLET | ORAL | 0 refills | Status: DC | PRN

## 2019-09-19 NOTE — Discharge Instructions (Signed)
Abdominal Hysterectomy, Care After This sheet gives you information about how to care for yourself after your procedure. Your health care provider may also give you more specific instructions. If you have problems or questions, contact your health care provider. What can I expect after the procedure? After your procedure, it is common to have:  Pain.  Fatigue.  Poor appetite.  Less interest in sex.  Vaginal bleeding and discharge. You may need to use a sanitary napkin after this procedure. Follow these instructions at home: Bathing  Do not take baths, swim, or use a hot tub until your health care provider approves. Ask your health care provider if you can take showers. You may only be allowed to take sponge baths for bathing.  Keep the bandage (dressing) dry until your health care provider says it can be removed. Incision care   Follow instructions from your health care provider about how to take care of your incision. Make sure you: ? Wash your hands with soap and water before you change your bandage (dressing). If soap and water are not available, use hand sanitizer. ? Change your dressing as told by your health care provider. ? Leave stitches (sutures), skin glue, or adhesive strips in place. These skin closures may need to stay in place for 2 weeks or longer. If adhesive strip edges start to loosen and curl up, you may trim the loose edges. Do not remove adhesive strips completely unless your health care provider tells you to do that.  Check your incision area every day for signs of infection. Check for: ? Redness, swelling, or pain. ? Fluid or blood. ? Warmth. ? Pus or a bad smell. Activity  Do gentle, daily exercises as told by your health care provider. You may be told to take short walks every day and go farther each time.  Do not lift anything that is heavier than 10 lb (4.5 kg), or the limit that your health care provider tells you, until he or she says that it is  safe.  Do not drive or use heavy machinery while taking prescription pain medicine.  Do not drive for 24 hours if you were given a medicine to help you relax (sedative).  Follow your health care provider's instructions about exercise, driving, and general activities. Ask your health care provider what activities are safe for you. Lifestyle  Do not douche, use tampons, or have sex for at least 6 weeks or as told by your health care provider.  Do not drink alcohol until your health care provider approves.  Drink enough fluid to keep your urine clear or pale yellow.  Try to have someone at home with you for the first 1-2 weeks to help.  Do not use any products that contain nicotine or tobacco, such as cigarettes and e-cigarettes. These can delay healing. If you need help quitting, ask your health care provider. General instructions  Take over-the-counter and prescription medicines only as told by your health care provider.  Do not take aspirin or ibuprofen. These medicines can cause bleeding.  To prevent or treat constipation while you are taking prescription pain medicine, your health care provider may recommend that you: ? Drink enough fluid to keep your urine clear or pale yellow. ? Take over-the-counter or prescription medicines. ? Eat foods that are high in fiber, such as fresh fruits and vegetables, whole grains, and beans. ? Limit foods that are high in fat and processed sugars, such as fried and sweet foods.  Keep all   follow-up visits as told by your health care provider. This is important. Contact a health care provider if:  You have chills or fever.  You have redness, swelling, or pain around your incision.  You have fluid or blood coming from your incision.  Your incision feels warm to the touch.  You have pus or a bad smell coming from your incision.  Your incision breaks open.  You feel dizzy or light-headed.  You have pain or bleeding when you urinate.  You  have persistent diarrhea.  You have persistent nausea and vomiting.  You have abnormal vaginal discharge.  You have a rash.  You have any type of abnormal reaction or you develop an allergy to your medicine.  Your pain medicine does not help. Get help right away if:  You have a fever and your symptoms suddenly get worse.  You have severe abdominal pain.  You have shortness of breath.  You faint.  You have pain, swelling, or redness in your leg.  You have heavy vaginal bleeding with blood clots. Summary  After your procedure, it is common to have pain, fatigue and vaginal discharge.  Do not take baths, swim, or use a hot tub until your health care provider approves. Ask your health care provider if you can take showers. You may only be allowed to take sponge baths for bathing.  Follow your health care provider's instructions about exercise, driving, and general activities. Ask your health care provider what activities are safe for you.  Do not lift anything that is heavier than 10 lb (4.5 kg), or the limit that your health care provider tells you, until he or she says that it is safe.  Try to have someone at home with you for the first 1-2 weeks to help. This information is not intended to replace advice given to you by your health care provider. Make sure you discuss any questions you have with your health care provider. Document Revised: 04/17/2018 Document Reviewed: 03/02/2016 Elsevier Patient Education  2020 [employer]  

## 2019-09-19 NOTE — Addendum Note (Signed)
Addendum  created 09/19/19 1256 by Vista Deck, CRNA   Charge Capture section accepted

## 2019-09-19 NOTE — Discharge Summary (Signed)
Physician Discharge Summary  Patient ID: Amanda Morrow MRN: [id number] DOB/AGE: [birth year] 49 y.o.  Admit date: 09/18/2019 Discharge date: 09/19/2019  Admission Diagnoses: 26 weeks size fibroid uterus  Discharge Diagnoses:  Active Problems:   S/P hysterectomy   S/P hysterectomy with oophorectomy   Discharged Condition: good  Hospital Course: unremarkable post op course, discharge POD #1  Consults: None  Significant Diagnostic Studies: labs:   Results for orders placed or performed during the hospital encounter of 09/18/19 (from the past 72 hour(s))  CBC     Status: Abnormal   Collection Time: 09/19/19  4:36 AM  Result Value Ref Range   WBC 13.6 (H) 4.0 - 10.5 K/uL   RBC 2.81 (L) 3.87 - 5.11 MIL/uL   Hemoglobin 8.8 (L) 12.0 - 15.0 g/dL   HCT 25.8 (L) 36 - 46 %   MCV 91.8 80.0 - 100.0 fL   MCH 31.3 26.0 - 34.0 pg   MCHC 34.1 30.0 - 36.0 g/dL   RDW 13.0 11.5 - 15.5 %   Platelets 268 150 - 400 K/uL   nRBC 0.0 0.0 - 0.2 %    Comment: Performed at [facility], [address], [place], [place] [address]  Basic metabolic panel     Status: Abnormal   Collection Time: 09/19/19  4:36 AM  Result Value Ref Range   Sodium 136 135 - 145 mmol/L   Potassium 3.2 (L) 3.5 - 5.1 mmol/L   Chloride 105 98 - 111 mmol/L   CO2 21 (L) 22 - 32 mmol/L   Glucose, Bld 134 (H) 70 - 99 mg/dL    Comment: Glucose reference range applies only to samples taken after fasting for at least 8 hours.   BUN 10 6 - 20 mg/dL   Creatinine, Ser 0.81 0.44 - 1.00 mg/dL   Calcium 8.4 (L) 8.9 - 10.3 mg/dL   GFR calc non Af Amer >60 >60 mL/min   GFR calc Af Amer >60 >60 mL/min   Anion gap 10 5 - 15    Comment: Performed at [facility], [address] [place],  [address]    Treatments: surgery: TAH BSO  Discharge Exam: Blood pressure 139/64, pulse 86, temperature 98.1 F (36.7 C), temperature source Oral, resp. rate 18, height 5\' 8"  (1.727 m), weight 99.8 kg, last menstrual period 08/24/2019, SpO2 98  %. General appearance: alert, cooperative and no distress GI: soft, non-tender; bowel sounds normal; no masses,  no organomegaly Incision/Wound: clean dry intact  Disposition: Discharge disposition: 01-Home or Self Care       Discharge Instructions    Call MD for:  persistant nausea and vomiting   Complete by: As directed    Call MD for:  severe uncontrolled pain   Complete by: As directed    Call MD for:  temperature >100.4   Complete by: As directed    Diet - low sodium heart healthy   Complete by: As directed    Driving Restrictions   Complete by: As directed    No driving for 1 week   Increase activity slowly   Complete by: As directed    Leave dressing on - Keep it clean, dry, and intact until clinic visit   Complete by: As directed    Lifting restrictions   Complete by: As directed    Do not lift more than 10 pounds for 6 weeks   Sexual Activity Restrictions   Complete by: As directed    No sex for 8 weeks  Follow-up Information    Florian Buff, MD Follow up.   Specialties: Obstetrics and Gynecology, Radiology Why: as scheduled Contact information: [place] [address] [phone]               Signed: Florian Buff 09/19/2019, 1:19 PM

## 2019-09-19 NOTE — Progress Notes (Signed)
NURSING PROGRESS NOTE  Amanda Morrow [id number] Discharge Data: 09/19/2019 3:17 PM Attending Provider: Florian Buff, MD JSR:PRXYVO, Amanda Moment, MD     Amanda Morrow to be D/C'd Home per MD order.  Discussed with the patient the After Visit Summary and all questions fully answered. All IV's discontinued with no bleeding noted. All belongings returned to patient for patient to take home.   Last Vital Signs:  Blood pressure (!) 151/69, pulse 94, temperature 97.6 F (36.4 C), temperature source Oral, resp. rate 18, height 5\' 8"  (1.727 m), weight 99.8 kg, last menstrual period 08/24/2019, SpO2 98 %.  Discharge Medication List Allergies as of 09/19/2019      Reactions   Morphine And Related    "makes me violently sick"       Medication List    STOP taking these medications   Ferrous Sulfate 27 MG Tabs   megestrol 40 MG tablet Commonly known as: MEGACE     TAKE these medications   amLODipine 10 MG tablet Commonly known as: NORVASC TAKE ONE TABLET BY MOUTH DAILY. Notes to patient: Next dose due tomorrow   Biotin 800 MCG Tabs Take 800 mcg by mouth daily.   hydrochlorothiazide 25 MG tablet Commonly known as: HYDRODIURIL Take 25 mg by mouth daily. Notes to patient: Next dose due tomorrow   ketorolac 10 MG tablet Commonly known as: TORADOL Take 1 tablet (10 mg total) by mouth every 8 (eight) hours as needed.   nitroGLYCERIN 0.4 MG SL tablet Commonly known as: NITROSTAT Place 0.4 mg under the tongue every 5 (five) minutes as needed for chest pain.   ondansetron 8 MG tablet Commonly known as: ZOFRAN Take 1 tablet (8 mg total) by mouth every 6 (six) hours as needed for nausea.   Potassium 99 MG Tabs Take 99 mg by mouth daily.   potassium chloride 20 MEQ packet Commonly known as: KLOR-CON Take 20 mEq by mouth daily.   Womens Multivitamin Tabs Take 1 tablet by mouth daily.            Discharge Care Instructions  (From admission, onward)         Start     Ordered    09/19/19 0000  Leave dressing on - Keep it clean, dry, and intact until clinic visit        09/19/19 [place], RN

## 2019-09-20 LAB — SURGICAL PATHOLOGY

## 2019-09-23 LAB — BPAM RBC
Blood Product Expiration Date: 202107172359
Blood Product Expiration Date: 202107212359
ISSUE DATE / TIME: 202106230944
Unit Type and Rh: 1700
Unit Type and Rh: 1700

## 2019-09-23 LAB — TYPE AND SCREEN
ABO/RH(D): B POS
Antibody Screen: NEGATIVE
Unit division: 0
Unit division: 0

## 2019-10-01 ENCOUNTER — Encounter: Payer: Self-pay | Admitting: Obstetrics & Gynecology

## 2019-10-01 ENCOUNTER — Ambulatory Visit (INDEPENDENT_AMBULATORY_CARE_PROVIDER_SITE_OTHER): Payer: Self-pay | Admitting: Obstetrics & Gynecology

## 2019-10-01 ENCOUNTER — Other Ambulatory Visit: Payer: Self-pay

## 2019-10-01 VITALS — BP 145/84 | HR 79 | Ht 68.0 in | Wt 206.5 lb

## 2019-10-01 DIAGNOSIS — Z90710 Acquired absence of both cervix and uterus: Secondary | ICD-10-CM

## 2019-10-01 DIAGNOSIS — R7989 Other specified abnormal findings of blood chemistry: Secondary | ICD-10-CM

## 2019-10-01 DIAGNOSIS — Z862 Personal history of diseases of the blood and blood-forming organs and certain disorders involving the immune mechanism: Secondary | ICD-10-CM

## 2019-10-01 DIAGNOSIS — Z48816 Encounter for surgical aftercare following surgery on the genitourinary system: Secondary | ICD-10-CM

## 2019-10-01 LAB — POCT HEMOGLOBIN: Hemoglobin: 10.6 g/dL — AB (ref 11–14.6)

## 2019-10-01 NOTE — Progress Notes (Signed)
  HPI: Patient returns for routine postoperative follow-up having undergone TAH BSO on 6/30.  The patient's immediate postoperative recovery has been unremarkable. Since hospital discharge the patient reports no problems normal bowel function.   Current Outpatient Medications: amLODipine (NORVASC) 10 MG tablet, TAKE ONE TABLET BY MOUTH DAILY. (Patient taking differently: Take 10 mg by mouth daily. ), Disp: 90 tablet, Rfl: 1 Biotin 800 MCG TABS, Take 800 mcg by mouth daily., Disp: , Rfl:  hydrochlorothiazide (HYDRODIURIL) 25 MG tablet, Take 25 mg by mouth daily., Disp: , Rfl:  ketorolac (TORADOL) 10 MG tablet, Take 1 tablet (10 mg total) by mouth every 8 (eight) hours as needed., Disp: 15 tablet, Rfl: 0 Multiple Vitamins-Minerals (WOMENS MULTIVITAMIN) TABS, Take 1 tablet by mouth daily., Disp: , Rfl:  nitroGLYCERIN (NITROSTAT) 0.4 MG SL tablet, Place 0.4 mg under the tongue every 5 (five) minutes as needed for chest pain., Disp: , Rfl:  ondansetron (ZOFRAN) 8 MG tablet, Take 1 tablet (8 mg total) by mouth every 6 (six) hours as needed for nausea., Disp: 20 tablet, Rfl: 0 potassium chloride (KLOR-CON) 20 MEQ packet, Take 20 mEq by mouth daily., Disp: , Rfl:   No current facility-administered medications for this visit.    Blood pressure (!) 145/84, pulse 79, height 5\' 8"  (1.727 m), weight 206 lb 8 oz (93.7 kg), last menstrual period 08/24/2019.  Physical Exam: Incision clean dry intact  Diagnostic Tests:   Pathology: benign  Impression: S/p TAH BSO  Plan:   Follow up: 4  weeks  Florian Buff, MD

## 2019-10-02 ENCOUNTER — Encounter: Payer: Self-pay | Admitting: Obstetrics & Gynecology

## 2019-10-05 ENCOUNTER — Other Ambulatory Visit: Payer: Self-pay | Admitting: Cardiovascular Disease

## 2019-10-29 ENCOUNTER — Encounter: Payer: Self-pay | Admitting: Obstetrics & Gynecology

## 2019-10-29 ENCOUNTER — Ambulatory Visit (INDEPENDENT_AMBULATORY_CARE_PROVIDER_SITE_OTHER): Payer: Self-pay | Admitting: Obstetrics & Gynecology

## 2019-10-29 VITALS — BP 152/87 | HR 79 | Ht 68.0 in | Wt 212.0 lb

## 2019-10-29 DIAGNOSIS — Z90710 Acquired absence of both cervix and uterus: Secondary | ICD-10-CM

## 2019-10-29 NOTE — Progress Notes (Signed)
  HPI: Patient returns for routine postoperative follow-up having undergone TAH BSO on 09/19/19.  The patient's immediate postoperative recovery has been unremarkable. Since hospital discharge the patient reports no problems doing well.   Current Outpatient Medications: amLODipine (NORVASC) 10 MG tablet, TAKE ONE TABLET BY MOUTH DAILY., Disp: 90 tablet, Rfl: 1 Biotin 800 MCG TABS, Take 800 mcg by mouth daily., Disp: , Rfl:  hydrochlorothiazide (HYDRODIURIL) 25 MG tablet, Take 25 mg by mouth daily., Disp: , Rfl:  ketorolac (TORADOL) 10 MG tablet, Take 1 tablet (10 mg total) by mouth every 8 (eight) hours as needed., Disp: 15 tablet, Rfl: 0 Multiple Vitamins-Minerals (WOMENS MULTIVITAMIN) TABS, Take 1 tablet by mouth daily., Disp: , Rfl:  nitroGLYCERIN (NITROSTAT) 0.4 MG SL tablet, Place 0.4 mg under the tongue every 5 (five) minutes as needed for chest pain., Disp: , Rfl:  ondansetron (ZOFRAN) 8 MG tablet, Take 1 tablet (8 mg total) by mouth every 6 (six) hours as needed for nausea., Disp: 20 tablet, Rfl: 0 potassium chloride (KLOR-CON) 20 MEQ packet, Take 20 mEq by mouth daily., Disp: , Rfl:   No current facility-administered medications for this visit.    Blood pressure (!) 152/87, pulse 79, height 5\' 8"  (1.727 m), weight 212 lb (96.2 kg), last menstrual period 08/24/2019.  Physical Exam: Incision clean dry intact Abdomen benign Cuff is well healed  Diagnostic Tests:   Pathology: benign  Impression: S/p TAH BSO  Plan:   Follow up: prn    Florian Buff, MD

## 2019-12-22 IMAGING — NM NM MYOCAR MULTI W/SPECT W/WALL MOTION & EF
2 series · 12 of 12 positions shown · non-contrast
Comparison: none

[Series 1: rest · 6.51mm/px · 6 of 64 frames shown]
[frame 6/64]
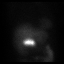
[frame 16/64]
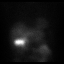
[frame 27/64]
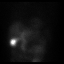
[frame 38/64]
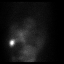
[frame 48/64]
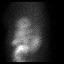
[frame 59/64]
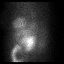

[Series 3: stress gated - perfusion · 6.51mm/px · 6 of 64 frames shown]
[frame 6/64]
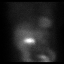
[frame 16/64]
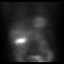
[frame 27/64]
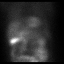
[frame 38/64]
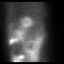
[frame 48/64]
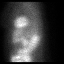
[frame 59/64]
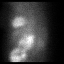

[12 of 12 positions shown; findings below may reference images not displayed]

Canned report from images found in remote index.

Refer to host system for actual result text.

## 2020-05-01 ENCOUNTER — Other Ambulatory Visit: Payer: Self-pay | Admitting: Family Medicine

## 2020-08-13 ENCOUNTER — Ambulatory Visit: Payer: Self-pay | Admitting: Family Medicine

## 2020-08-17 NOTE — Progress Notes (Signed)
Cardiology Office Note  Date: 08/18/2020   ID: Amanda Morrow, DOB [birth year], MRN [id number]  PCP:  Manon Hilding, MD  Cardiologist:  None Electrophysiologist:  None   Chief Complaint: Cardiac follow up  History of Present Illness: Amanda Morrow is a 50 y.o. female with a history of HTN, HLD, GERD, abnormal EKG  Last seen by Dr Amanda Morrow 08/13/2019. She denied any anginal symptoms. ECG was markedly abnormal and suspicious for ischemic heart disease. Nuclear stress test was low risk. Echocardiogram demonstrated normal systolic function . ASA was stopped due to anemia. BP was normal and there were no changes to therapy.   She is here today for 1 year follow-up.  She states in the interim since last visit she had a hysterectomy without any complications.  Otherwise she denies any acute illnesses or hospitalizations.  She states she has been quite sometime since she seen her PCP Dr. Quintin Morrow.  Denies any anginal or exertional symptoms, palpitations or arrhythmias, orthostatic symptoms i.e. lightheadedness, dizziness, presyncopal or syncopal episodes.  Denies any PND, orthopnea, shortness of breath or DOE.  Denies any CVA or TIA-like symptoms, bleeding issues.  Denies any claudication-like symptoms, DVT or PE-like symptoms.  She has a history of abnormal EKG.  EKG today shows normal sinus rhythm rate of 65, ST elevation, consider inferior injury or acute infarct, consider right ventricular involvement and acute inferior infarct.  She states this is the reason she was originally referred to cardiology.  Past Medical History:  Diagnosis Date  . Anemia   . Gastroesophageal reflux   . Hypercholesterolemia   . Hypertension     Past Surgical History:  Procedure Laterality Date  . APPENDECTOMY  2004  . CESAREAN SECTION    . HYSTERECTOMY ABDOMINAL WITH SALPINGO-OOPHORECTOMY  09/18/2019   Procedure: TOTAL ABDOMINAL HYSTERECTOMY WITH BILATERAL SALPINGO-OOPHORECTOMY;  Surgeon: Florian Buff, MD;   Location: [facility];  Service: Gynecology;;    Current Outpatient Medications  Medication Sig Dispense Refill  . amLODipine (NORVASC) 10 MG tablet TAKE ONE TABLET BY MOUTH DAILY. 90 tablet 0  . Biotin 800 MCG TABS Take 800 mcg by mouth daily.    . hydrochlorothiazide (HYDRODIURIL) 25 MG tablet Take 25 mg by mouth daily.    . Multiple Vitamins-Minerals (WOMENS MULTIVITAMIN) TABS Take 1 tablet by mouth daily.    . nitroGLYCERIN (NITROSTAT) 0.4 MG SL tablet Place 0.4 mg under the tongue every 5 (five) minutes as needed for chest pain.    . potassium chloride (KLOR-CON) 20 MEQ packet Take 20 mEq by mouth daily.     No current facility-administered medications for this visit.   Allergies:  Morphine and related   Social History: The patient  reports that she quit smoking about a year ago. Her smoking use included cigarettes. She has a 18.75 pack-year smoking history. She has never used smokeless tobacco. She reports current alcohol use. She reports previous drug use.   Family History: The patient's family history includes Cancer in her father and mother; Diabetes in her father and mother; Hypertension in her father and mother.   ROS:  Please see the history of present illness. Otherwise, complete review of systems is positive for none.  All other systems are reviewed and negative.   Physical Exam: VS:  BP 130/80   Pulse 68   Ht 5\' 8"  (1.727 m)   Wt 218 lb 12.8 oz (99.2 kg)   LMP 08/24/2019   SpO2 98%   BMI 33.27 kg/m , BMI Body  mass index is 33.27 kg/m.  Wt Readings from Last 3 Encounters:  08/18/20 218 lb 12.8 oz (99.2 kg)  10/29/19 212 lb (96.2 kg)  10/01/19 206 lb 8 oz (93.7 kg)    General: Patient appears comfortable at rest. Neck: Supple, no elevated JVP or carotid bruits, no thyromegaly. Lungs: Clear to auscultation, nonlabored breathing at rest. Cardiac: Regular rate and rhythm, no S3 or significant systolic murmur, no pericardial rub. Extremities: No pitting edema, distal  pulses 2+. Skin: Warm and dry. Musculoskeletal: No kyphosis. Neuropsychiatric: Alert and oriented x3, affect grossly appropriate.  ECG:  An ECG dated 08/18/2020 was personally reviewed today and demonstrated:  Normal sinus rhythm rate of 65.  ST elevation, consider inferior infarct or acute infarct.    Recent Labwork: 09/16/2019: ALT 25; AST 13 09/19/2019: BUN 10; Creatinine, Ser 0.81; Platelets 268; Potassium 3.2; Sodium 136 10/01/2019: Hemoglobin 10.6  No results found for: CHOL, TRIG, HDL, CHOLHDL, VLDL, LDLCALC, LDLDIRECT  Other Studies Reviewed Today:  Echocardiogram 11/02/2017 Study Conclusions   - Left ventricle: The cavity size was normal. Wall thickness was  normal. Systolic function was normal. The estimated ejection  fraction was in the range of 60% to 65%. The study is not  technically sufficient to allow evaluation of LV diastolic  function.    NST 11/02/2017 Study Result  Narrative & Impression   CLincally negative, electrically positive though specificity limtied as changes improved before end of exercise  Normal perfusion  This is a low risk study.  Nuclear stress EF: 59%.     Assessment and Plan:  1. Essential hypertension   2. Abnormal electrocardiogram (ECG) (EKG)     1. Essential hypertension Blood pressure is well controlled today at 130/80.  Continue amlodipine 10 mg daily.  Continue HCTZ 25 mg daily.  2. Abnormal electrocardiogram (ECG) (EKG) EKG continues to show abnormality.  Today's EKG normal sinus rhythm rate of 65, ST elevation, consider inferior infarct or acute infarct, consider right ventricular involvement and acute inferior infarct.  She denies any anginal or exertional symptoms.  Previous EKGs also show evidence of abnormalities as well.  Medication Adjustments/Labs and Tests Ordered: Current medicines are reviewed at length with the patient today.  Concerns regarding medicines are outlined above.   Disposition: Follow-up with  Dr. Harl Bowie or APP 1 year  Signed, Amanda July, NP 08/18/2020 10:33 AM    [facility] [place], [place], [place] [address] Phone: [phone]; Fax: [phone]

## 2020-08-18 ENCOUNTER — Ambulatory Visit (INDEPENDENT_AMBULATORY_CARE_PROVIDER_SITE_OTHER): Payer: Self-pay | Admitting: Family Medicine

## 2020-08-18 ENCOUNTER — Encounter: Payer: Self-pay | Admitting: Family Medicine

## 2020-08-18 VITALS — BP 130/80 | HR 68 | Ht 68.0 in | Wt 218.8 lb

## 2020-08-18 DIAGNOSIS — I10 Essential (primary) hypertension: Secondary | ICD-10-CM

## 2020-08-18 DIAGNOSIS — R9431 Abnormal electrocardiogram [ECG] [EKG]: Secondary | ICD-10-CM

## 2020-08-18 NOTE — Patient Instructions (Signed)

## 2020-09-01 ENCOUNTER — Other Ambulatory Visit: Payer: Self-pay

## 2020-09-01 ENCOUNTER — Ambulatory Visit (INDEPENDENT_AMBULATORY_CARE_PROVIDER_SITE_OTHER): Payer: Self-pay | Admitting: General Surgery

## 2020-09-01 ENCOUNTER — Encounter: Payer: Self-pay | Admitting: General Surgery

## 2020-09-01 VITALS — BP 146/83 | HR 80 | Temp 98.4°F | Resp 16 | Ht 68.0 in | Wt 213.0 lb

## 2020-09-01 DIAGNOSIS — K8020 Calculus of gallbladder without cholecystitis without obstruction: Secondary | ICD-10-CM | POA: Insufficient documentation

## 2020-09-01 NOTE — Patient Instructions (Signed)
Cholelithiasis  Cholelithiasis happens when gallstones form in the gallbladder. The gallbladder stores bile. Bile is a fluid that helps digest fats. Bile can harden and form into gallstones. If they cause a blockage, they can cause pain (gallbladder attack). What are the causes? This condition may be caused by:  Some blood diseases, such as sickle cell anemia.  Too much of a fat-like substance (cholesterol) in your bile.  Not enough bile salts in your bile. These salts help the body absorb and digest fats.  The gallbladder not emptying fully or often enough. This is common in pregnant women. What increases the risk? The following factors may make you more likely to develop this condition:  Being female.  Being pregnant many times.  Eating a lot of fried foods, fat, and refined carbs (refined carbohydrates).  Being very overweight (obese).  Being older than age 66.  Using medicines with female hormones in them for a long time.  Losing weight fast.  Having gallstones in your family.  Having some health problems, such as diabetes, Crohn's disease, or liver disease. What are the signs or symptoms? Often, there may be gallstones but no symptoms. These gallstones are called silent gallstones. If a gallstone causes a blockage, you may get sudden pain. The pain:  Can be in the upper right part of your belly (abdomen).  Normally comes at night or after you eat.  Can last an hour or more.  Can spread to your right shoulder, back, or chest.  Can feel like discomfort, burning, or fullness in the upper part of your belly (indigestion). If the blockage lasts more than a few hours, you can get an infection or swelling. You may:  Feel like you may vomit.  Vomit.  Feel bloated.  Have belly pain for 5 hours or more.  Feel tender in your belly, often in the upper right part and under your ribs.  Have fever or chills.  Have skin or the white parts of your eyes turn yellow  (jaundice).  Have dark pee (urine) or pale poop (stool). How is this treated? Treatment for this condition depends on how bad you feel. If you have symptoms, you may need:  Home care, if symptoms are not very bad. ? Do not eat for 12-24 hours. Drink only water and clear liquids. ? Start to eat simple or clear foods after 1 or 2 days. Try broths and crackers. ? You may need medicines for pain or stomach upset or both. ? If you have an infection, you will need antibiotics.  A hospital stay, if you have very bad pain or a very bad infection.  Surgery to remove your gallbladder. You may need this if: ? Gallstones keep coming back. ? You have very bad symptoms.  Medicines to break up gallstones. Medicines: ? Are best for small gallstones. ? May be used for up to 6-12 months.  A procedure to find and take out gallstones or to break up gallstones. Follow these instructions at home: Medicines  Take over-the-counter and prescription medicines only as told by your doctor.  If you were prescribed an antibiotic medicine, take it as told by your doctor. Do not stop taking the antibiotic even if you start to feel better.  Ask your doctor if the medicine prescribed to you requires you to avoid driving or using machinery. Eating and drinking  Drink enough fluid to keep your urine pale yellow. Drink water or clear fluids. This is important when you have pain.  Eat  healthy foods. Choose: ? Fewer fatty foods, such as fried foods. ? Fewer refined carbs. Avoid breads and grains that are highly processed, such as white bread and white rice. Choose whole grains, such as whole-wheat bread and brown rice. ? More fiber. Almonds, fresh fruit, and beans are healthy sources. General instructions  Keep a healthy weight.  Keep all follow-up visits as told by your doctor. This is important. Where to find more information  [employer] [facility] Diabetes and Digestive and Kidney Diseases:  [email or web address] Contact a doctor if:  You have sudden pain in the upper right part of your belly. Pain might spread to your right shoulder, back, or chest.  You have been diagnosed with gallstones that have no symptoms and you get: ? Belly pain. ? Discomfort, burning, or fullness in the upper part of your abdomen.  You have dark urine or pale stools. Get help right away if:  You have sudden pain in the upper right part of your abdomen, and the pain lasts more than 2 hours.  You have pain in your abdomen, and: ? It lasts more than 5 hours. ? It keeps getting worse.  You have a fever or chills.  You keep feeling like you may vomit.  You keep vomiting.  Your skin or the white parts of your eyes turn yellow. Summary  Cholelithiasis happens when gallstones form in the gallbladder.  This condition may be caused by a blood disease, too much of a fat-like substance in the bile, or not enough bile salts in bile.  Treatment for this condition depends on how bad you feel.  If you have symptoms, do not eat or drink. You may need medicines. You may need a hospital stay for very bad pain or a very bad infection.  You may need surgery if gallstones keep coming back or if you have very bad symptoms. This information is not intended to replace advice given to you by your health care provider. Make sure you discuss any questions you have with your health care provider. Document Revised: 05/03/2019 Document Reviewed: 02/04/2019 Elsevier Patient Education  2021 [employer].    Minimally Invasive Cholecystectomy Minimally invasive cholecystectomy is surgery to remove the gallbladder. The gallbladder is a pear-shaped organ that lies beneath the liver on the right side of the body. The gallbladder stores bile, which is a fluid that helps the body digest fats. Cholecystectomy is often done to treat inflammation of the gallbladder (cholecystitis). This condition is usually caused by a buildup  of gallstones (cholelithiasis) in the gallbladder. Gallstones can block the flow of bile, which can result in inflammation and pain. In severe cases, emergency surgery may be required. This procedure is done though small incisions in the abdomen, instead of one large incision. It is also called laparoscopic surgery. A thin scope with a camera (laparoscope) is inserted through one incision. Then surgical instruments are inserted through the other incisions. In some cases, a minimally invasive surgery may need to be changed to a surgery that is done through a larger incision. This is called open surgery. Tell a health care provider about:  Any allergies you have.  All medicines you are taking, including vitamins, herbs, eye drops, creams, and over-the-counter medicines.  Any problems you or family members have had with anesthetic medicines.  Any blood disorders you have.  Any surgeries you have had.  Any medical conditions you have.  Whether you are pregnant or may be pregnant. What are the risks? Generally, this is  a safe procedure. However, problems may occur, including:  Infection.  Bleeding.  Allergic reactions to medicines.  Damage to nearby structures or organs.  A stone remaining in the common bile duct. The common bile duct carries bile from the gallbladder into the small intestine.  A bile leak from the cyst duct that is clipped when your gallbladder is removed. What happens before the procedure? Medicines Ask your health care provider about:  Changing or stopping your regular medicines. This is especially important if you are taking diabetes medicines or blood thinners.  Taking medicines such as aspirin and ibuprofen. These medicines can thin your blood. Do not take these medicines unless your health care provider tells you to take them.  Taking over-the-counter medicines, vitamins, herbs, and supplements. General instructions  Let your health care provider know if  you develop a cold or an infection before surgery.  Plan to have someone take you home from the hospital or clinic.  If you will be going home right after the procedure, plan to have someone with you for 24 hours.  Ask your health care provider: ? How your surgery site will be marked. ? What steps will be taken to help prevent infection. These may include:  Removing hair at the surgery site.  Washing skin with a germ-killing soap.  Taking antibiotic medicine. What happens during the procedure?  An IV will be inserted into one of your veins.  You will be given one or both of the following: ? A medicine to help you relax (sedative). ? A medicine to make you fall asleep (general anesthetic).  A breathing tube will be placed in your mouth.  Your surgeon will make several small incisions in your abdomen.  The laparoscope will be inserted through one of the small incisions. The camera on the laparoscope will send images to a monitor in the operating room. This lets your surgeon see inside your abdomen.  A gas will be pumped into your abdomen. This will expand your abdomen to give the surgeon more room to perform the surgery.  Other tools that are needed for the procedure will be inserted through the other incisions. The gallbladder will be removed through one of the incisions.  Your common bile duct may be examined. If stones are found in the common bile duct, they may be removed.  After your gallbladder has been removed, the incisions will be closed with stitches (sutures), staples, or skin glue.  Your incisions may be covered with a bandage (dressing). The procedure may vary among health care providers and hospitals.   What happens after the procedure?  Your blood pressure, heart rate, breathing rate, and blood oxygen level will be monitored until you leave the hospital or clinic.  You will be given medicines as needed to control your pain.  If you were given a sedative  during the procedure, it can affect you for several hours. Do not drive or operate machinery until your health care provider says that it is safe. Summary  Minimally invasive cholecystectomy, also called laparoscopic cholecystectomy, is surgery to remove the gallbladder using small incisions.  Tell your health care provider about all the medical conditions you have and all the medicines you are taking for those conditions.  Before the procedure, follow instructions about eating or drinking restrictions and changing or stopping medicines.  If you were given a sedative during the procedure, it can affect you for several hours. Do not drive or operate machinery until your health care provider  says that it is safe. This information is not intended to replace advice given to you by your health care provider. Make sure you discuss any questions you have with your health care provider. Document Revised: 12/17/2018 Document Reviewed: 12/17/2018 Elsevier Patient Education  [place].

## 2020-09-01 NOTE — Progress Notes (Signed)
[facility] History and Physical  Reason for Referral: Gallstones  Referring Physician:  Manon Hilding, MD    Chief Complaint     New Patient (Initial Visit)       Amanda Morrow is a 50 y.o. female.  HPI:  Amanda Morrow has had issues with epigastric and RUQ pain with associated nausea, vomiting and reflux for about 2 months. She says that she has the pain more frequently now and it radiates to the right side and back. She says that it is sharp in nature and feels like it is becoming more frequent. She wants to proceed with get her gallbladder removed so she does not have another attack.   Past Medical History:  Diagnosis Date   Anemia    Gastroesophageal reflux    Hypercholesterolemia    Hypertension     Past Surgical History:  Procedure Laterality Date   APPENDECTOMY  2004   CESAREAN SECTION     HYSTERECTOMY ABDOMINAL WITH SALPINGO-OOPHORECTOMY  09/18/2019   Procedure: TOTAL ABDOMINAL HYSTERECTOMY WITH BILATERAL SALPINGO-OOPHORECTOMY;  Surgeon: Florian Buff, MD;  Location: [facility];  Service: Gynecology;;    Family History  Problem Relation Age of Onset   Hypertension Mother    Diabetes Mother    Cancer Mother    Diabetes Father    Hypertension Father    Cancer Father     Social History   Tobacco Use   Smoking status: Current Every Day Smoker    Packs/day: 0.75    Years: 25.00    Pack years: 18.75    Types: Cigarettes   Smokeless tobacco: Never Used  Vaping Use   Vaping Use: Never used  Substance Use Topics   Alcohol use: Yes    Comment: occasionally   Drug use: Not Currently    Medications: I have reviewed the patient's current medications. Allergies as of 09/01/2020       Reactions   Morphine And Related    "makes me violently sick"         Medication List        Accurate as of September 01, 2020  9:06 AM. If you have any questions, ask your nurse or doctor.          amLODipine 10 MG tablet Commonly known as: NORVASC TAKE ONE  TABLET BY MOUTH DAILY.   Biotin 800 MCG Tabs Take 800 mcg by mouth daily.   hydrochlorothiazide 25 MG tablet Commonly known as: HYDRODIURIL Take 25 mg by mouth daily.   nitroGLYCERIN 0.4 MG SL tablet Commonly known as: NITROSTAT Place 0.4 mg under the tongue every 5 (five) minutes as needed for chest pain.   potassium chloride 20 MEQ packet Commonly known as: KLOR-CON Take 20 mEq by mouth daily.   Womens Multivitamin Tabs Take 1 tablet by mouth daily.         ROS:  A comprehensive review of systems was negative except for: Gastrointestinal: positive for abdominal pain, nausea, reflux symptoms, and vomiting  Blood pressure (!) 146/83, pulse 80, temperature 98.4 F (36.9 C), temperature source Other (Comment), resp. rate 16, height 5\' 8"  (1.727 m), weight 213 lb (96.6 kg), last menstrual period 08/24/2019, SpO2 98 %. Physical Exam Vitals reviewed.  Constitutional:      Appearance: Normal appearance.  HENT:     Head: Normocephalic.     Nose: Nose normal.  Eyes:     Extraocular Movements: Extraocular movements intact.  Cardiovascular:     Rate and Rhythm: Normal  rate and regular rhythm.  Pulmonary:     Effort: Pulmonary effort is normal.     Breath sounds: Normal breath sounds.  Abdominal:     General: There is no distension.     Palpations: Abdomen is soft.     Tenderness: There is abdominal tenderness in the right upper quadrant and epigastric area.  Musculoskeletal:        General: Normal range of motion.     Cervical back: Normal range of motion.  Skin:    General: Skin is warm.  Neurological:     General: No focal deficit present.     Mental Status: She is alert and oriented to person, place, and time.  Psychiatric:        Mood and Affect: Mood normal.        Behavior: Behavior normal.        Thought Content: Thought content normal.    Results: OSH US EXAM:  ULTRASOUND ABDOMEN LIMITED RIGHT UPPER QUADRANT   COMPARISON:  None.   FINDINGS:   Gallbladder:   Large 2 cm shadowing gallstone. No wall thickening visualized. No  sonographic Murphy sign noted by sonographer.   Common bile duct:   Diameter: 4 mm, normal.   Liver:   No focal lesion identified. Increased in parenchymal echogenicity.  Portal vein is patent on color Doppler imaging with normal direction  of blood flow towards the liver.   Other: None.   IMPRESSION:  1. Cholelithiasis without sonographic evidence of acute  cholecystitis.  2. Hepatic steatosis.    Electronically Signed    By: Titus Dubin M.D.    On: 08/26/2020 10:12   Assessment & Plan:  Amanda Morrow is a 50 y.o. female with gallstones and symptoms.  PLAN: I counseled the patient about the indication, risks and benefits of laparoscopic cholecystectomy.  She understands there is a very small chance for bleeding, infection, injury to normal structures (including common bile duct), conversion to open surgery, persistent symptoms, evolution of postcholecystectomy diarrhea, need for secondary interventions, anesthesia reaction, cardiopulmonary issues and other risks not specifically detailed here. I described the expected recovery, the plan for follow-up and the restrictions during the recovery phase.  All questions were answered.   All questions were answered to the satisfaction of the patient.     Amanda Morrow 09/01/2020, 9:06 AM

## 2020-09-01 NOTE — Patient Instructions (Addendum)
Amanda Morrow  09/01/2020     @PREFPERIOPPHARMACY @   Your procedure is scheduled on  09/07/2020.   Report to [facility] at  [email or web address].   Call this number if you have problems the morning of surgery:  [phone]   Remember:  Do not eat or drink after midnight.                        Take these medicines the morning of surgery with A SIP OF WATER    Amlodipine.     Please brush your teeth.  Do not wear jewelry, make-up or nail polish.  Do not wear lotions, powders, or perfumes, or deodorant.  Do not shave 48 hours prior to surgery.  Men may shave face and neck.  Do not bring valuables to the hospital.  [facility] is not responsible for any belongings or valuables.  Contacts, dentures or bridgework may not be worn into surgery.  Leave your suitcase in the car.  After surgery it may be brought to your room.  For patients admitted to the hospital, discharge time will be determined by your treatment team.  Patients discharged the day of surgery will not be allowed to drive home and must have someone with them for 24 hours.    Special instructions:  DO NOT smoke tobacco or vape for 24 hours.  Please read over the following fact sheets that you were given. Coughing and Deep Breathing, Surgical Site Infection Prevention, Anesthesia Post-op Instructions and Care and Recovery After Surgery       General Anesthesia, Adult, Care After This sheet gives you information about how to care for yourself after your procedure. Your health care provider may also give you more specific instructions. If you have problems or questions, contact your health care provider. What can I expect after the procedure? After the procedure, the following side effects are common:  Pain or discomfort at the IV site.  Nausea.  Vomiting.  Sore throat.  Trouble concentrating.  Feeling cold or chills.  Feeling weak or tired.  Sleepiness and fatigue.  Soreness and body aches. These  side effects can affect parts of the body that were not involved in surgery. Follow these instructions at home: For the time period you were told by your health care provider:  Rest.  Do not participate in activities where you could fall or become injured.  Do not drive or use machinery.  Do not drink alcohol.  Do not take sleeping pills or medicines that cause drowsiness.  Do not make important decisions or sign legal documents.  Do not take care of children on your own.   Eating and drinking  Follow any instructions from your health care provider about eating or drinking restrictions.  When you feel hungry, start by eating small amounts of foods that are soft and easy to digest (bland), such as toast. Gradually return to your regular diet.  Drink enough fluid to keep your urine pale yellow.  If you vomit, rehydrate by drinking water, juice, or clear broth. General instructions  If you have sleep apnea, surgery and certain medicines can increase your risk for breathing problems. Follow instructions from your health care provider about wearing your sleep device: ? Anytime you are sleeping, including during daytime naps. ? While taking prescription pain medicines, sleeping medicines, or medicines that make you drowsy.  Have a responsible adult stay with you for the  time you are told. It is important to have someone help care for you until you are awake and alert.  Return to your normal activities as told by your health care provider. Ask your health care provider what activities are safe for you.  Take over-the-counter and prescription medicines only as told by your health care provider.  If you smoke, do not smoke without supervision.  Keep all follow-up visits as told by your health care provider. This is important. Contact a health care provider if:  You have nausea or vomiting that does not get better with medicine.  You cannot eat or drink without vomiting.  You  have pain that does not get better with medicine.  You are unable to pass urine.  You develop a skin rash.  You have a fever.  You have redness around your IV site that gets worse. Get help right away if:  You have difficulty breathing.  You have chest pain.  You have blood in your urine or stool, or you vomit blood. Summary  After the procedure, it is common to have a sore throat or nausea. It is also common to feel tired.  Have a responsible adult stay with you for the time you are told. It is important to have someone help care for you until you are awake and alert.  When you feel hungry, start by eating small amounts of foods that are soft and easy to digest (bland), such as toast. Gradually return to your regular diet.  Drink enough fluid to keep your urine pale yellow.  Return to your normal activities as told by your health care provider. Ask your health care provider what activities are safe for you. This information is not intended to replace advice given to you by your health care provider. Make sure you discuss any questions you have with your health care provider. Document Revised: 11/28/2019 Document Reviewed: 06/27/2019 Elsevier Patient Education  2021 [place].  Minimally Invasive Cholecystectomy, Care After This sheet gives you information about how to care for yourself after your procedure. Your health care provider may also give you more specific instructions. If you have problems or questions, contact your health care provider. What can I expect after the procedure? After the procedure, it is common to have:  Pain at your incision sites. You will be given medicines to control this pain.  Mild nausea or vomiting.  Bloating and possible shoulder pain from the gas that was used during the procedure. Follow these instructions at home: Medicines  Take over-the-counter and prescription medicines only as told by your health care provider.  If you were  prescribed an antibiotic medicine, take or use it as told by your health care provider. Do not stop using the antibiotic even if you start to feel better.  Ask your health care provider if the medicine prescribed to you: ? Requires you to avoid driving or using machinery. ? Can cause constipation. You may need to take these actions to prevent or treat constipation:  Drink enough fluid to keep your urine pale yellow.  Take over-the-counter or prescription medicines.  Eat foods that are high in fiber, such as beans, whole grains, and fresh fruits and vegetables.  Limit foods that are high in fat and processed sugars, such as fried or sweet foods. Incision care  Follow instructions from your health care provider about how to take care of your incisions. Make sure you: ? Wash your hands with soap and water for at least  20 seconds before and after you change your bandage (dressing). If soap and water are not available, use hand sanitizer. ? Change your dressing as told by your health care provider. ? Leave stitches (sutures), skin glue, or adhesive strips in place. These skin closures may need to be in place for 2 weeks or longer. If adhesive strip edges start to loosen and curl up, you may trim the loose edges. Do not remove adhesive strips completely unless your health care provider tells you to do that.  Do not take baths, swim, or use a hot tub until your health care provider approves. Ask your health care provider if you may take showers. You may only be allowed to take sponge baths.  Check your incision area every day for signs of infection. Check for: ? More redness, swelling, or pain. ? Fluid or blood. ? Warmth. ? Pus or a bad smell.   Activity  Rest as told by your health care provider.  Avoid sitting for a long time without moving. Get up to take short walks every 1-2 hours. This is important to improve blood flow and breathing. Ask for help if you feel weak or unsteady.  Do not  lift anything that is heavier than 10 lb (4.5 kg), or the limit that you are told, until your health care provider says that it is safe.  Do not play contact sports until your health care provider approves.  Do not return to work or school until your health care provider approves.  Return to your normal activities as told by your health care provider. Ask your health care provider what activities are safe for you. General instructions  If you were given a sedative during the procedure, it can affect you for several hours. Do not drive or operate machinery until your health care provider says that it is safe.  Keep all follow-up visits as told by your health care provider. This is important. Contact a health care provider if:  You develop a rash.  You have more redness, swelling, or pain around your incisions.  You have fluid or blood coming from your incisions.  Your incisions feel warm to the touch.  You have pus or a bad smell coming from your incisions.  You have a fever.  One or more of your incisions breaks open. Get help right away if:  You have trouble breathing.  You have chest pain.  You have increasing pain in your shoulders.  You faint or feel dizzy when you stand.  You have severe pain in your abdomen.  You have nausea or vomiting that lasts for more than one day.  You have leg pain. Summary  After your procedure, it is common to have pain at the incision sites. You may also have nausea or bloating.  Follow your health care provider's instructions about medicine, activity restrictions, and caring for your incision areas. Do not do activities that require a lot of effort.  Contact a health care provider if you have a fever or other signs of infection, such as more redness, swelling, or pain around the incisions.  Get help right away if you have chest pain, increasing pain in the shoulders, or trouble breathing. This information is not intended to replace  advice given to you by your health care provider. Make sure you discuss any questions you have with your health care provider. Document Revised: 12/12/2018 Document Reviewed: 12/12/2018 Elsevier Patient Education  2021 [place].   How to Use Chlorhexidine  for Bathing Chlorhexidine gluconate (CHG) is a germ-killing (antiseptic) solution that is used to clean the skin. It can get rid of the bacteria that normally live on the skin and can keep them away for about 24 hours. To clean your skin with CHG, you may be given:  A CHG solution to use in the shower or as part of a sponge bath.  A prepackaged cloth that contains CHG. Cleaning your skin with CHG may help lower the risk for infection:  While you are staying in the intensive care unit of the hospital.  If you have a vascular access, such as a central line, to provide short-term or long-term access to your veins.  If you have a catheter to drain urine from your bladder.  If you are on a ventilator. A ventilator is a machine that helps you breathe by moving air in and out of your lungs.  After surgery. What are the risks? Risks of using CHG include:  A skin reaction.  Hearing loss, if CHG gets in your ears.  Eye injury, if CHG gets in your eyes and is not rinsed out.  The CHG product catching fire. Make sure that you avoid smoking and flames after applying CHG to your skin. Do not use CHG:  If you have a chlorhexidine allergy or have previously reacted to chlorhexidine.  On babies younger than 65 months of age. How to use CHG solution  Use CHG only as told by your health care provider, and follow the instructions on the label.  Use the full amount of CHG as directed. Usually, this is one bottle. During a shower Follow these steps when using CHG solution during a shower (unless your health care provider gives you different instructions): 1. Start the shower. 2. Use your normal soap and shampoo to wash your face and  hair. 3. Turn off the shower or move out of the shower stream. 4. Pour the CHG onto a clean washcloth. Do not use any type of brush or rough-edged sponge. 5. Starting at your neck, lather your body down to your toes. Make sure you follow these instructions: ? If you will be having surgery, pay special attention to the part of your body where you will be having surgery. Scrub this area for at least 1 minute. ? Do not use CHG on your head or face. If the solution gets into your ears or eyes, rinse them well with water. ? Avoid your genital area. ? Avoid any areas of skin that have broken skin, cuts, or scrapes. ? Scrub your back and under your arms. Make sure to wash skin folds. 6. Let the lather sit on your skin for 1-2 minutes or as long as told by your health care provider. 7. Thoroughly rinse your entire body in the shower. Make sure that all body creases and crevices are rinsed well. 8. Dry off with a clean towel. Do not put any substances on your body afterward--such as powder, lotion, or perfume--unless you are told to do so by your health care provider. Only use lotions that are recommended by the manufacturer. 9. Put on clean clothes or pajamas. 10. If it is the night before your surgery, sleep in clean sheets.   During a sponge bath Follow these steps when using CHG solution during a sponge bath (unless your health care provider gives you different instructions): 1. Use your normal soap and shampoo to wash your face and hair. 2. Pour the CHG onto a clean washcloth.  3. Starting at your neck, lather your body down to your toes. Make sure you follow these instructions: ? If you will be having surgery, pay special attention to the part of your body where you will be having surgery. Scrub this area for at least 1 minute. ? Do not use CHG on your head or face. If the solution gets into your ears or eyes, rinse them well with water. ? Avoid your genital area. ? Avoid any areas of skin that  have broken skin, cuts, or scrapes. ? Scrub your back and under your arms. Make sure to wash skin folds. 4. Let the lather sit on your skin for 1-2 minutes or as long as told by your health care provider. 5. Using a different clean, wet washcloth, thoroughly rinse your entire body. Make sure that all body creases and crevices are rinsed well. 6. Dry off with a clean towel. Do not put any substances on your body afterward--such as powder, lotion, or perfume--unless you are told to do so by your health care provider. Only use lotions that are recommended by the manufacturer. 7. Put on clean clothes or pajamas. 8. If it is the night before your surgery, sleep in clean sheets. How to use CHG prepackaged cloths  Only use CHG cloths as told by your health care provider, and follow the instructions on the label.  Use the CHG cloth on clean, dry skin.  Do not use the CHG cloth on your head or face unless your health care provider tells you to.  When washing with the CHG cloth: ? Avoid your genital area. ? Avoid any areas of skin that have broken skin, cuts, or scrapes. Before surgery Follow these steps when using a CHG cloth to clean before surgery (unless your health care provider gives you different instructions): 1. Using the CHG cloth, vigorously scrub the part of your body where you will be having surgery. Scrub using a back-and-forth motion for 3 minutes. The area on your body should be completely wet with CHG when you are done scrubbing. 2. Do not rinse. Discard the cloth and let the area air-dry. Do not put any substances on the area afterward, such as powder, lotion, or perfume. 3. Put on clean clothes or pajamas. 4. If it is the night before your surgery, sleep in clean sheets.   For general bathing Follow these steps when using CHG cloths for general bathing (unless your health care provider gives you different instructions). 1. Use a separate CHG cloth for each area of your body. Make  sure you wash between any folds of skin and between your fingers and toes. Wash your body in the following order, switching to a new cloth after each step: ? The front of your neck, shoulders, and chest. ? Both of your arms, under your arms, and your hands. ? Your stomach and groin area, avoiding the genitals. ? Your right leg and foot. ? Your left leg and foot. ? The back of your neck, your back, and your buttocks. 2. Do not rinse. Discard the cloth and let the area air-dry. Do not put any substances on your body afterward--such as powder, lotion, or perfume--unless you are told to do so by your health care provider. Only use lotions that are recommended by the manufacturer. 3. Put on clean clothes or pajamas. Contact a health care provider if:  Your skin gets irritated after scrubbing.  You have questions about using your solution or cloth. Get help right away  if:  Your eyes become very red or swollen.  Your eyes itch badly.  Your skin itches badly and is red or swollen.  Your hearing changes.  You have trouble seeing.  You have swelling or tingling in your mouth or throat.  You have trouble breathing.  You swallow any chlorhexidine. Summary  Chlorhexidine gluconate (CHG) is a germ-killing (antiseptic) solution that is used to clean the skin. Cleaning your skin with CHG may help to lower your risk for infection.  You may be given CHG to use for bathing. It may be in a bottle or in a prepackaged cloth to use on your skin. Carefully follow your health care provider's instructions and the instructions on the product label.  Do not use CHG if you have a chlorhexidine allergy.  Contact your health care provider if your skin gets irritated after scrubbing. This information is not intended to replace advice given to you by your health care provider. Make sure you discuss any questions you have with your health care provider. Document Revised: 08/30/2019 Document Reviewed:  08/30/2019 Elsevier Patient Education  [place].

## 2020-09-04 ENCOUNTER — Other Ambulatory Visit: Payer: Self-pay

## 2020-09-04 ENCOUNTER — Encounter (HOSPITAL_COMMUNITY): Payer: Self-pay

## 2020-09-04 ENCOUNTER — Encounter (HOSPITAL_COMMUNITY)
Admission: RE | Admit: 2020-09-04 | Discharge: 2020-09-04 | Disposition: A | Discharge status: Home / Self Care | Payer: Self-pay | Source: Ambulatory Visit | Attending: General Surgery | Admitting: General Surgery

## 2020-09-04 NOTE — H&P (Signed)
[facility] History and Physical  Reason for Referral: Gallstones  Referring Physician:  Manon Hilding, MD    Chief Complaint     New Patient (Initial Visit)       Amanda Morrow is a 50 y.o. female.  HPI:  Amanda Morrow has had issues with epigastric and RUQ pain with associated nausea, vomiting and reflux for about 2 months. She says that she has the pain more frequently now and it radiates to the right side and back. She says that it is sharp in nature and feels like it is becoming more frequent. She wants to proceed with get her gallbladder removed so she does not have another attack.   Past Medical History:  Diagnosis Date   Anemia    Gastroesophageal reflux    Hypercholesterolemia    Hypertension     Past Surgical History:  Procedure Laterality Date   APPENDECTOMY  2004   CESAREAN SECTION     HYSTERECTOMY ABDOMINAL WITH SALPINGO-OOPHORECTOMY  09/18/2019   Procedure: TOTAL ABDOMINAL HYSTERECTOMY WITH BILATERAL SALPINGO-OOPHORECTOMY;  Surgeon: Florian Buff, MD;  Location: [facility];  Service: Gynecology;;    Family History  Problem Relation Age of Onset   Hypertension Mother    Diabetes Mother    Cancer Mother    Diabetes Father    Hypertension Father    Cancer Father     Social History   Tobacco Use   Smoking status: Current Every Day Smoker    Packs/day: 0.75    Years: 25.00    Pack years: 18.75    Types: Cigarettes   Smokeless tobacco: Never Used  Vaping Use   Vaping Use: Never used  Substance Use Topics   Alcohol use: Yes    Comment: occasionally   Drug use: Not Currently    Medications: I have reviewed the patient's current medications. Allergies as of 09/01/2020       Reactions   Morphine And Related    "makes me violently sick"         Medication List        Accurate as of September 01, 2020  9:06 AM. If you have any questions, ask your nurse or doctor.          amLODipine 10 MG tablet Commonly known as: NORVASC TAKE ONE  TABLET BY MOUTH DAILY.   Biotin 800 MCG Tabs Take 800 mcg by mouth daily.   hydrochlorothiazide 25 MG tablet Commonly known as: HYDRODIURIL Take 25 mg by mouth daily.   nitroGLYCERIN 0.4 MG SL tablet Commonly known as: NITROSTAT Place 0.4 mg under the tongue every 5 (five) minutes as needed for chest pain.   potassium chloride 20 MEQ packet Commonly known as: KLOR-CON Take 20 mEq by mouth daily.   Womens Multivitamin Tabs Take 1 tablet by mouth daily.         ROS:  A comprehensive review of systems was negative except for: Gastrointestinal: positive for abdominal pain, nausea, reflux symptoms, and vomiting  Blood pressure (!) 146/83, pulse 80, temperature 98.4 F (36.9 C), temperature source Other (Comment), resp. rate 16, height 5\' 8"  (1.727 m), weight 213 lb (96.6 kg), last menstrual period 08/24/2019, SpO2 98 %. Physical Exam Vitals reviewed.  Constitutional:      Appearance: Normal appearance.  HENT:     Head: Normocephalic.     Nose: Nose normal.  Eyes:     Extraocular Movements: Extraocular movements intact.  Cardiovascular:     Rate and Rhythm: Normal  rate and regular rhythm.  Pulmonary:     Effort: Pulmonary effort is normal.     Breath sounds: Normal breath sounds.  Abdominal:     General: There is no distension.     Palpations: Abdomen is soft.     Tenderness: There is abdominal tenderness in the right upper quadrant and epigastric area.  Musculoskeletal:        General: Normal range of motion.     Cervical back: Normal range of motion.  Skin:    General: Skin is warm.  Neurological:     General: No focal deficit present.     Mental Status: She is alert and oriented to person, place, and time.  Psychiatric:        Mood and Affect: Mood normal.        Behavior: Behavior normal.        Thought Content: Thought content normal.    Results: OSH US EXAM:  ULTRASOUND ABDOMEN LIMITED RIGHT UPPER QUADRANT   COMPARISON:  None.   FINDINGS:   Gallbladder:   Large 2 cm shadowing gallstone. No wall thickening visualized. No  sonographic Murphy sign noted by sonographer.   Common bile duct:   Diameter: 4 mm, normal.   Liver:   No focal lesion identified. Increased in parenchymal echogenicity.  Portal vein is patent on color Doppler imaging with normal direction  of blood flow towards the liver.   Other: None.   IMPRESSION:  1. Cholelithiasis without sonographic evidence of acute  cholecystitis.  2. Hepatic steatosis.    Electronically Signed    By: Titus Dubin M.D.    On: 08/26/2020 10:12   Assessment & Plan:  Amanda Morrow is a 50 y.o. female with gallstones and symptoms.  PLAN: I counseled the patient about the indication, risks and benefits of laparoscopic cholecystectomy.  She understands there is a very small chance for bleeding, infection, injury to normal structures (including common bile duct), conversion to open surgery, persistent symptoms, evolution of postcholecystectomy diarrhea, need for secondary interventions, anesthesia reaction, cardiopulmonary issues and other risks not specifically detailed here. I described the expected recovery, the plan for follow-up and the restrictions during the recovery phase.  All questions were answered.   All questions were answered to the satisfaction of the patient.     Virl Cagey 09/01/2020, 9:06 AM

## 2020-09-07 ENCOUNTER — Other Ambulatory Visit: Payer: Self-pay

## 2020-09-07 ENCOUNTER — Encounter (HOSPITAL_COMMUNITY)
Admission: RE | Disposition: A | Discharge status: Home / Self Care | Payer: Self-pay | Source: Ambulatory Visit | Attending: General Surgery

## 2020-09-07 ENCOUNTER — Encounter (HOSPITAL_COMMUNITY): Payer: Self-pay | Admitting: General Surgery

## 2020-09-07 ENCOUNTER — Ambulatory Visit (HOSPITAL_COMMUNITY): Payer: Self-pay | Admitting: Certified Registered Nurse Anesthetist

## 2020-09-07 ENCOUNTER — Ambulatory Visit (HOSPITAL_COMMUNITY)
Admission: RE | Admit: 2020-09-07 | Discharge: 2020-09-07 | Disposition: A | Discharge status: Home / Self Care | Payer: Self-pay | Source: Ambulatory Visit | Attending: General Surgery | Admitting: General Surgery

## 2020-09-07 DIAGNOSIS — K219 Gastro-esophageal reflux disease without esophagitis: Secondary | ICD-10-CM | POA: Insufficient documentation

## 2020-09-07 DIAGNOSIS — Z833 Family history of diabetes mellitus: Secondary | ICD-10-CM | POA: Insufficient documentation

## 2020-09-07 DIAGNOSIS — F17210 Nicotine dependence, cigarettes, uncomplicated: Secondary | ICD-10-CM | POA: Insufficient documentation

## 2020-09-07 DIAGNOSIS — Z8249 Family history of ischemic heart disease and other diseases of the circulatory system: Secondary | ICD-10-CM | POA: Insufficient documentation

## 2020-09-07 DIAGNOSIS — Z885 Allergy status to narcotic agent status: Secondary | ICD-10-CM | POA: Insufficient documentation

## 2020-09-07 DIAGNOSIS — K8020 Calculus of gallbladder without cholecystitis without obstruction: Secondary | ICD-10-CM | POA: Diagnosis present

## 2020-09-07 HISTORY — PX: CHOLECYSTECTOMY: SHX55

## 2020-09-07 SURGERY — LAPAROSCOPIC CHOLECYSTECTOMY
Anesthesia: General

## 2020-09-07 MED ORDER — BUPIVACAINE HCL (PF) 0.5 % IJ SOLN
INTRAMUSCULAR | Status: DC | PRN
  Administered 2020-09-07: 15 mL

## 2020-09-07 MED ORDER — BUPIVACAINE HCL (PF) 0.5 % IJ SOLN
INTRAMUSCULAR | Status: AC
  Filled 2020-09-07: qty 30

## 2020-09-07 MED ORDER — DEXAMETHASONE SODIUM PHOSPHATE 10 MG/ML IJ SOLN
INTRAMUSCULAR | Status: DC | PRN
  Administered 2020-09-07: 10 mg via INTRAVENOUS

## 2020-09-07 MED ORDER — CHLORHEXIDINE GLUCONATE CLOTH 2 % EX PADS: 6.0000 | MEDICATED_PAD | Freq: Once | CUTANEOUS | Status: DC

## 2020-09-07 MED ORDER — FENTANYL CITRATE (PF) 100 MCG/2ML IJ SOLN
INTRAMUSCULAR | Status: AC
  Filled 2020-09-07: qty 2

## 2020-09-07 MED ORDER — SODIUM CHLORIDE 0.9 % IR SOLN
Status: DC | PRN
  Administered 2020-09-07: 1000 mL

## 2020-09-07 MED ORDER — PROPOFOL 10 MG/ML IV BOLUS
INTRAVENOUS | Status: DC | PRN
  Administered 2020-09-07: 200 mg via INTRAVENOUS

## 2020-09-07 MED ORDER — SODIUM CHLORIDE 0.9 % IV SOLN
1.0000 g | INTRAVENOUS | Status: AC
  Filled 2020-09-07: qty 1

## 2020-09-07 MED ORDER — FENTANYL CITRATE (PF) 250 MCG/5ML IJ SOLN
INTRAMUSCULAR | Status: AC
  Filled 2020-09-07: qty 5

## 2020-09-07 MED ORDER — DEXAMETHASONE SODIUM PHOSPHATE 10 MG/ML IJ SOLN
INTRAMUSCULAR | Status: AC
  Filled 2020-09-07: qty 1

## 2020-09-07 MED ORDER — SODIUM CHLORIDE 0.9 % IV SOLN
INTRAVENOUS | Status: AC
  Filled 2020-09-07 (×2): qty 1

## 2020-09-07 MED ORDER — PROPOFOL 10 MG/ML IV BOLUS
INTRAVENOUS | Status: AC
  Filled 2020-09-07: qty 20

## 2020-09-07 MED ORDER — SCOPOLAMINE 1 MG/3DAYS TD PT72
MEDICATED_PATCH | TRANSDERMAL | Status: AC
  Filled 2020-09-07: qty 1

## 2020-09-07 MED ORDER — ORAL CARE MOUTH RINSE: 15.0000 mL | Freq: Once | OROMUCOSAL | Status: AC

## 2020-09-07 MED ORDER — ROCURONIUM BROMIDE 10 MG/ML (PF) SYRINGE
PREFILLED_SYRINGE | INTRAVENOUS | Status: AC
  Filled 2020-09-07: qty 10

## 2020-09-07 MED ORDER — ONDANSETRON HCL 4 MG/2ML IJ SOLN
INTRAMUSCULAR | Status: AC
  Filled 2020-09-07: qty 2

## 2020-09-07 MED ORDER — SUGAMMADEX SODIUM 500 MG/5ML IV SOLN
INTRAVENOUS | Status: DC | PRN
  Administered 2020-09-07: 200 mg via INTRAVENOUS

## 2020-09-07 MED ORDER — ONDANSETRON HCL 4 MG/2ML IJ SOLN
INTRAMUSCULAR | Status: DC | PRN
  Administered 2020-09-07: 4 mg via INTRAVENOUS

## 2020-09-07 MED ORDER — LACTATED RINGERS IV SOLN: INTRAVENOUS | Status: DC

## 2020-09-07 MED ORDER — MIDAZOLAM HCL 2 MG/2ML IJ SOLN
INTRAMUSCULAR | Status: AC
  Filled 2020-09-07: qty 2

## 2020-09-07 MED ORDER — MIDAZOLAM HCL 2 MG/2ML IJ SOLN
INTRAMUSCULAR | Status: DC | PRN
  Administered 2020-09-07: 2 mg via INTRAVENOUS

## 2020-09-07 MED ORDER — SCOPOLAMINE 1 MG/3DAYS TD PT72
1.0000 | MEDICATED_PATCH | Freq: Once | TRANSDERMAL | Status: DC
  Administered 2020-09-07: 1.5 mg via TRANSDERMAL

## 2020-09-07 MED ORDER — ROCURONIUM BROMIDE 10 MG/ML (PF) SYRINGE
PREFILLED_SYRINGE | INTRAVENOUS | Status: DC | PRN
  Administered 2020-09-07: 60 mg via INTRAVENOUS

## 2020-09-07 MED ORDER — SODIUM CHLORIDE 0.9 % IV SOLN
INTRAVENOUS | Status: DC | PRN
  Administered 2020-09-07 (×2): 1 g via INTRAVENOUS

## 2020-09-07 MED ORDER — HYDROMORPHONE HCL 1 MG/ML IJ SOLN: 0.2500 mg | INTRAMUSCULAR | Status: DC | PRN

## 2020-09-07 MED ORDER — FENTANYL CITRATE (PF) 100 MCG/2ML IJ SOLN
INTRAMUSCULAR | Status: DC | PRN
  Administered 2020-09-07 (×3): 50 ug via INTRAVENOUS
  Administered 2020-09-07 (×2): 100 ug via INTRAVENOUS

## 2020-09-07 MED ORDER — SCOPOLAMINE 1 MG/3DAYS TD PT72: 1.0000 | MEDICATED_PATCH | TRANSDERMAL | Status: DC

## 2020-09-07 MED ORDER — LIDOCAINE HCL (PF) 2 % IJ SOLN
INTRAMUSCULAR | Status: AC
  Filled 2020-09-07: qty 5

## 2020-09-07 MED ORDER — OXYCODONE HCL 5 MG PO TABS: 5.0000 mg | ORAL_TABLET | ORAL | 0 refills | Status: AC | PRN

## 2020-09-07 MED ORDER — LIDOCAINE HCL (CARDIAC) PF 100 MG/5ML IV SOSY
PREFILLED_SYRINGE | INTRAVENOUS | Status: DC | PRN
  Administered 2020-09-07: 60 mg via INTRATRACHEAL

## 2020-09-07 MED ORDER — ONDANSETRON HCL 4 MG PO TABS: 4.0000 mg | ORAL_TABLET | Freq: Three times a day (TID) | ORAL | 1 refills | Status: AC | PRN

## 2020-09-07 MED ORDER — CHLORHEXIDINE GLUCONATE 0.12 % MT SOLN
15.0000 mL | Freq: Once | OROMUCOSAL | Status: AC
  Administered 2020-09-07: 15 mL via OROMUCOSAL

## 2020-09-07 MED ORDER — ONDANSETRON HCL 4 MG/2ML IJ SOLN: 4.0000 mg | Freq: Once | INTRAMUSCULAR | Status: DC | PRN

## 2020-09-07 MED ORDER — HEMOSTATIC AGENTS (NO CHARGE) OPTIME
TOPICAL | Status: DC | PRN
  Administered 2020-09-07: 1 via TOPICAL

## 2020-09-07 SURGICAL SUPPLY — 43 items
ADH SKN CLS APL DERMABOND .7 (GAUZE/BANDAGES/DRESSINGS) ×1
APL PRP STRL LF DISP 70% ISPRP (MISCELLANEOUS) ×1
APPLIER CLIP ROT 10 11.4 M/L (STAPLE) ×3
APR CLP MED LRG 11.4X10 (STAPLE) ×1
BAG RETRIEVAL 10 (BASKET) ×1
BAG RETRIEVAL 10MM (BASKET) ×1
BLADE SURG 15 STRL LF DISP TIS (BLADE) ×1 IMPLANT
BLADE SURG 15 STRL SS (BLADE) ×3
CHLORAPREP W/TINT 26 (MISCELLANEOUS) ×3 IMPLANT
CLIP APPLIE ROT 10 11.4 M/L (STAPLE) ×1 IMPLANT
CLOTH BEACON ORANGE TIMEOUT ST (SAFETY) ×3 IMPLANT
COVER LIGHT HANDLE STERIS (MISCELLANEOUS) ×6 IMPLANT
COVER WAND RF STERILE (DRAPES) ×3 IMPLANT
DECANTER SPIKE VIAL GLASS SM (MISCELLANEOUS) ×3 IMPLANT
DERMABOND ADVANCED (GAUZE/BANDAGES/DRESSINGS) ×2
DERMABOND ADVANCED .7 DNX12 (GAUZE/BANDAGES/DRESSINGS) ×1 IMPLANT
ELECT REM PT RETURN 9FT ADLT (ELECTROSURGICAL) ×3
ELECTRODE REM PT RTRN 9FT ADLT (ELECTROSURGICAL) ×1 IMPLANT
GLOVE SURG ENC MOIS LTX SZ6.5 (GLOVE) ×3 IMPLANT
GLOVE SURG UNDER POLY LF SZ6.5 (GLOVE) ×3 IMPLANT
GLOVE SURG UNDER POLY LF SZ7 (GLOVE) ×9 IMPLANT
GOWN STRL REUS W/TWL LRG LVL3 (GOWN DISPOSABLE) ×9 IMPLANT
HEMOSTAT SNOW SURGICEL 2X4 (HEMOSTASIS) ×3 IMPLANT
INST SET LAPROSCOPIC AP (KITS) ×3 IMPLANT
KIT TURNOVER KIT A (KITS) ×3 IMPLANT
MANIFOLD NEPTUNE II (INSTRUMENTS) ×3 IMPLANT
NEEDLE INSUFFLATION 14GA 120MM (NEEDLE) ×3 IMPLANT
NS IRRIG 1000ML POUR BTL (IV SOLUTION) ×3 IMPLANT
PACK LAP CHOLE LZT030E (CUSTOM PROCEDURE TRAY) ×3 IMPLANT
PAD ARMBOARD 7.5X6 YLW CONV (MISCELLANEOUS) ×3 IMPLANT
SET BASIN LINEN APH (SET/KITS/TRAYS/PACK) ×3 IMPLANT
SET TUBE SMOKE EVAC HIGH FLOW (TUBING) ×3 IMPLANT
SLEEVE ENDOPATH XCEL 5M (ENDOMECHANICALS) ×3 IMPLANT
SUT MNCRL AB 4-0 PS2 18 (SUTURE) ×6 IMPLANT
SUT VICRYL 0 UR6 27IN ABS (SUTURE) ×3 IMPLANT
SYS BAG RETRIEVAL 10MM (BASKET) ×1
SYSTEM BAG RETRIEVAL 10MM (BASKET) ×1 IMPLANT
TROCAR ENDO BLADELESS 11MM (ENDOMECHANICALS) ×3 IMPLANT
TROCAR XCEL NON-BLD 5MMX100MML (ENDOMECHANICALS) ×3 IMPLANT
TROCAR XCEL UNIV SLVE 11M 100M (ENDOMECHANICALS) ×3 IMPLANT
TUBE CONNECTING 12'X1/4 (SUCTIONS) ×1
TUBE CONNECTING 12X1/4 (SUCTIONS) ×2 IMPLANT
WARMER LAPAROSCOPE (MISCELLANEOUS) ×3 IMPLANT

## 2020-09-07 NOTE — Interval H&P Note (Signed)
History and Physical Interval Note:  09/07/2020 11:03 AM  Amanda Morrow  has presented today for surgery, with the diagnosis of Cholelithiasis.  The various methods of treatment have been discussed with the patient and family. After consideration of risks, benefits and other options for treatment, the patient has consented to  Procedure(s): LAPAROSCOPIC CHOLECYSTECTOMY (N/A) as a surgical intervention.  The patient's history has been reviewed, patient examined, no change in status, stable for surgery.  I have reviewed the patient's chart and labs.  Questions were answered to the patient's satisfaction.     Virl Cagey

## 2020-09-07 NOTE — Anesthesia Postprocedure Evaluation (Signed)
Anesthesia Post Note  Patient: Amanda Morrow  Procedure(s) Performed: LAPAROSCOPIC CHOLECYSTECTOMY  Patient location during evaluation: Phase II Anesthesia Type: General Level of consciousness: awake Pain management: pain level controlled Vital Signs Assessment: post-procedure vital signs reviewed and stable Respiratory status: spontaneous breathing and respiratory function stable Cardiovascular status: blood pressure returned to baseline and stable Postop Assessment: no headache and no apparent nausea or vomiting Anesthetic complications: no Comments: Late entry   No notable events documented.   Last Vitals:  Vitals:   09/07/20 1300 09/07/20 1306  BP: (!) 170/81 (!) 188/87  Pulse: 80 85  Resp: 10 16  Temp:  36.9 C  SpO2: 94% 97%    Last Pain:  Vitals:   09/07/20 1306  TempSrc: Oral  PainSc: 0-No pain                 Louann Sjogren

## 2020-09-07 NOTE — Discharge Instructions (Addendum)
Discharge Laparoscopic Surgery Instructions:  Common Complaints: Right shoulder pain is common after laparoscopic surgery. This is secondary to the gas used in the surgery being trapped under the diaphragm.  Walk to help your body absorb the gas. This will improve in a few days. Pain at the port sites are common, especially the larger port sites. This will improve with time.  Some nausea is common and poor appetite. The main goal is to stay hydrated the first few days after surgery.   Diet/ Activity: Diet as tolerated. You may not have an appetite, but it is important to stay hydrated. Drink 64 ounces of water a day. Your appetite will return with time.  Shower per your regular routine daily.  Do not take hot showers. Take warm showers that are less than 10 minutes. Rest and listen to your body, but do not remain in bed all day.  Walk everyday for at least 15-20 minutes. Deep cough and move around every 1-2 hours in the first few days after surgery.  Do not lift > 10 lbs, perform excessive bending, pushing, pulling, squatting for 1-2 weeks after surgery.  Do not pick at the dermabond glue on your incision sites.  This glue film will remain in place for 1-2 weeks and will start to peel off.  Do not place lotions or balms on your incision unless instructed to specifically by Dr. Bridges.   Pain Expectations and Narcotics: -After surgery you will have pain associated with your incisions and this is normal. The pain is muscular and nerve pain, and will get better with time. -You are encouraged and expected to take non narcotic medications like tylenol and ibuprofen (when able) to treat pain as multiple modalities can aid with pain treatment. -Narcotics are only used when pain is severe or there is breakthrough pain. -You are not expected to have a pain score of 0 after surgery, as we cannot prevent pain. A pain score of 3-4 that allows you to be functional, move, walk, and tolerate some activity is  the goal. The pain will continue to improve over the days after surgery and is dependent on your surgery. -Due to [place] law, we are only able to give a certain amount of pain medication to treat post operative pain, and we only give additional narcotics on a patient by patient basis.  -For most laparoscopic surgery, studies have shown that the majority of patients only need 10-15 narcotic pills, and for open surgeries most patients only need 15-20.   -Having appropriate expectations of pain and knowledge of pain management with non narcotics is important as we do not want anyone to become addicted to narcotic pain medication.  -Using ice packs in the first 48 hours and heating pads after 48 hours, wearing an abdominal binder (when recommended), and using over the counter medications are all ways to help with pain management.   -Simple acts like meditation and mindfulness practices after surgery can also help with pain control and research has proven the benefit of these practices.  Medication: Take tylenol and ibuprofen as needed for pain control, alternating every 4-6 hours.  Example:  Tylenol 1000mg @ 6am, 12noon, 6pm, 12midnight (Do not exceed 4000mg of tylenol a day). Ibuprofen 800mg @ 9am, 3pm, 9pm, 3am (Do not exceed 3600mg of ibuprofen a day).  Take Roxicodone for breakthrough pain every 4 hours.  Take Colace for constipation related to narcotic pain medication. If you do not have a bowel movement in 2 days, take Miralax   over the counter.  Drink plenty of water to also prevent constipation.   Contact Information: If you have questions or concerns, please call our office, [phone], Monday- Thursday 8AM-5PM and Friday 8AM-12Noon.  If it is after hours or on the weekend, please call [facility] Main Number, [phone], [phone], and ask to speak to the surgeon on call for Dr. Bridges at [facility].   

## 2020-09-07 NOTE — Anesthesia Preprocedure Evaluation (Signed)
Anesthesia Evaluation  Patient identified by MRN, date of birth, ID band Patient awake    Reviewed: Allergy & Precautions, H&P , NPO status , Patient's Chart, lab work & pertinent test results, reviewed documented beta blocker date and time   Airway Mallampati: II  TM Distance: >3 FB Neck ROM: full    Dental no notable dental hx.    Pulmonary neg pulmonary ROS, Current Smoker,    Pulmonary exam normal breath sounds clear to auscultation       Cardiovascular Exercise Tolerance: Good hypertension, negative cardio ROS   Rhythm:regular Rate:Normal     Neuro/Psych negative neurological ROS  negative psych ROS   GI/Hepatic negative GI ROS, Neg liver ROS, GERD  Medicated,  Endo/Other  negative endocrine ROS  Renal/GU negative Renal ROS  negative genitourinary   Musculoskeletal   Abdominal   Peds  Hematology negative hematology ROS (+) Blood dyscrasia, anemia ,   Anesthesia Other Findings   Reproductive/Obstetrics negative OB ROS                             Anesthesia Physical Anesthesia Plan  ASA: 2  Anesthesia Plan: General and General ETT   Post-op Pain Management:    Induction:   PONV Risk Score and Plan: Ondansetron  Airway Management Planned:   Additional Equipment:   Intra-op Plan:   Post-operative Plan:   Informed Consent: I have reviewed the patients History and Physical, chart, labs and discussed the procedure including the risks, benefits and alternatives for the proposed anesthesia with the patient or authorized representative who has indicated his/her understanding and acceptance.     Dental Advisory Given  Plan Discussed with: CRNA  Anesthesia Plan Comments:         Anesthesia Quick Evaluation

## 2020-09-07 NOTE — Op Note (Signed)
Operative Note   Preoperative Diagnosis: Symptomatic cholelithiasis   Postoperative Diagnosis: Same   Procedure(s) Performed: Laparoscopic cholecystectomy   Surgeon: Ria Comment C. Constance Haw, MD   Assistants: No qualified resident was available   Anesthesia: General endotracheal   Anesthesiologist: Louann Sjogren, MD    Specimens: Gallbladder    Estimated Blood Loss: Minimal    Blood Replacement: None    Complications: None    Operative Findings: Distended gallbladder with stones    Procedure: The patient was taken to the operating room and placed supine. General endotracheal anesthesia was induced. Intravenous antibiotics were administered per protocol. An orogastric tube positioned to decompress the stomach. The abdomen was prepared and draped in the usual sterile fashion.    A supraumbilical incision was made and a Veress technique was utilized to achieve pneumoperitoneum to 15 mmHg with carbon dioxide. A 11 mm optiview port was placed through the supraumbilical region, and a 10 mm 0-degree operative laparoscope was introduced. The area underlying the trocar and Veress needle were inspected and without evidence of injury.  Remaining trocars were placed under direct vision. Two 5 mm ports were placed in the right abdomen, between the anterior axillary and midclavicular line.  A final 11 mm port was placed through the mid-epigastrium, near the falciform ligament.    The gallbladder fundus was elevated cephalad and the infundibulum was retracted to the patient's right. The gallbladder/cystic duct junction was skeletonized. The cystic artery noted in the triangle of Calot and was also skeletonized.  We then continued liberal medial and lateral dissection until the critical view of safety was achieved.    The cystic duct and cystic artery were triply clipped and both were divided. The gallbladder was then dissected from the liver bed with electrocautery. There was a posterior branch off the  artery that was clipped and divided. The specimen was placed in an Endopouch and was retrieved through the epigastric site.   Final inspection revealed acceptable hemostasis. Surgical SNOW was placed in the gallbladder bed.  Trocars were removed and pneumoperitoneum was released.  0 Vicryl fascial sutures were used to close the epigastric and umbilical port sites. Skin incisions were closed with 4-0 Monocryl subcuticular sutures and Dermabond. The patient was awakened from anesthesia and extubated without complication.    Curlene Labrum, MD [facility] [address] [place],  [address] [phone] (office)

## 2020-09-07 NOTE — Anesthesia Procedure Notes (Signed)
Procedure Name: Intubation Date/Time: 09/07/2020 11:43 AM Performed by: Karna Dupes, CRNA Pre-anesthesia Checklist: Patient identified, Emergency Drugs available, Suction available and Patient being monitored Patient Re-evaluated:Patient Re-evaluated prior to induction Oxygen Delivery Method: Circle system utilized Preoxygenation: Pre-oxygenation with 100% oxygen Induction Type: IV induction Ventilation: Mask ventilation without difficulty Laryngoscope Size: Mac and 3 Grade View: Grade I Tube type: Oral Tube size: 7.0 mm Number of attempts: 1 Airway Equipment and Method: Stylet and Oral airway Placement Confirmation: ETT inserted through vocal cords under direct vision, positive ETCO2 and breath sounds checked- equal and bilateral Secured at: 22 cm Tube secured with: Tape Dental Injury: Teeth and Oropharynx as per pre-operative assessment

## 2020-09-07 NOTE — Transfer of Care (Signed)
Immediate Anesthesia Transfer of Care Note  Patient: Amanda Morrow  Procedure(s) Performed: LAPAROSCOPIC CHOLECYSTECTOMY  Patient Location: PACU  Anesthesia Type:General  Level of Consciousness: awake, alert  and patient cooperative  Airway & Oxygen Therapy: Patient Spontanous Breathing and Patient connected to face mask oxygen  Post-op Assessment: Report given to RN and Post -op Vital signs reviewed and stable  Post vital signs: Reviewed and stable  Last Vitals:  Vitals Value Taken Time  BP 187/97 09/07/20 1231  Temp 97.7   Pulse 86 09/07/20 1233  Resp 13 09/07/20 1233  SpO2 100 % 09/07/20 1233  Vitals shown include unvalidated device data.  Last Pain:  Vitals:   09/07/20 0953  TempSrc: Oral  PainSc: 0-No pain      Patients Stated Pain Goal: 5 (53/74/82 7078)  Complications: No notable events documented.

## 2020-09-07 NOTE — Progress Notes (Signed)
[facility]  Left Shirlean Mylar friend a message as no answer on the phone.  Rx to [facility].   Curlene Labrum, MD [facility] [address] [place], [place] [address] [phone] (office)

## 2020-09-08 ENCOUNTER — Encounter (HOSPITAL_COMMUNITY): Payer: Self-pay | Admitting: General Surgery

## 2020-09-08 LAB — SURGICAL PATHOLOGY

## 2020-09-22 ENCOUNTER — Other Ambulatory Visit: Payer: Self-pay

## 2020-09-22 ENCOUNTER — Ambulatory Visit (INDEPENDENT_AMBULATORY_CARE_PROVIDER_SITE_OTHER): Payer: Self-pay | Admitting: General Surgery

## 2020-09-22 DIAGNOSIS — K8020 Calculus of gallbladder without cholecystitis without obstruction: Secondary | ICD-10-CM

## 2020-09-22 NOTE — Progress Notes (Signed)
[facility]  I am calling the patient for post operative evaluation. This is not a billable encounter as it is under the [place] charges for the surgery.  The patient had a laparoscopic cholecystectomy on 6/13. The patient reports that she is doing well and having no issues. She is feeling so much better now. The are tolerating a diet, having good pain control, and having regular Bms.  The incisions are healing with glue peeling. The patient has no concerns.   Pathology: FINAL MICROSCOPIC DIAGNOSIS:   A. GALLBLADDER, CHOLECYSTECTOMY:  - Cholesterolosis with benign polyp.  - Cholelithiasis.   Will see the patient PRN.   Curlene Labrum, MD [facility] [address] [place], [place] [address] [phone] (office)

## 2020-09-30 ENCOUNTER — Other Ambulatory Visit: Payer: Self-pay | Admitting: Family Medicine

## 2021-06-28 ENCOUNTER — Telehealth: Payer: Self-pay

## 2021-06-28 MED ORDER — AMLODIPINE BESYLATE 10 MG PO TABS: 10.0000 mg | ORAL_TABLET | Freq: Every day | ORAL | 1 refills | Status: DC

## 2021-06-28 NOTE — Telephone Encounter (Signed)
Pharmacy requesting refills for Amlodipine 10 mg tablets. Pt former pt of Dr. Jacinta Shoe and Katina Dung, NP. Please advise.  ?

## 2021-12-20 ENCOUNTER — Other Ambulatory Visit: Payer: Self-pay | Admitting: Student

## 2022-03-08 ENCOUNTER — Other Ambulatory Visit (HOSPITAL_COMMUNITY): Payer: Self-pay | Admitting: Obstetrics and Gynecology

## 2022-03-08 DIAGNOSIS — Z1231 Encounter for screening mammogram for malignant neoplasm of breast: Secondary | ICD-10-CM

## 2022-03-10 ENCOUNTER — Ambulatory Visit: Payer: Self-pay | Admitting: Cardiology

## 2022-03-18 ENCOUNTER — Other Ambulatory Visit: Payer: Self-pay

## 2022-03-18 ENCOUNTER — Ambulatory Visit (HOSPITAL_COMMUNITY)
Admission: RE | Admit: 2022-03-18 | Discharge: 2022-03-18 | Disposition: A | Discharge status: Home / Self Care | Payer: Self-pay | Source: Ambulatory Visit | Attending: Obstetrics and Gynecology | Admitting: Obstetrics and Gynecology

## 2022-03-18 ENCOUNTER — Inpatient Hospital Stay: Payer: Self-pay | Attending: Obstetrics and Gynecology | Admitting: Hematology and Oncology

## 2022-03-18 DIAGNOSIS — Z1211 Encounter for screening for malignant neoplasm of colon: Secondary | ICD-10-CM

## 2022-03-18 DIAGNOSIS — Z1231 Encounter for screening mammogram for malignant neoplasm of breast: Secondary | ICD-10-CM | POA: Insufficient documentation

## 2022-03-18 NOTE — Progress Notes (Signed)
Ms. Tenlee Wollin is a 51 y.o. female who presents to [facility] clinic today with no complaints.    Pap Smear: Pap not smear completed today. Hysterectomy in 2021 for benign fibroids. Physical exam: Breasts Breasts symmetrical. No skin abnormalities bilateral breasts. No nipple retraction bilateral breasts. No nipple discharge bilateral breasts. No lymphadenopathy. No lumps palpated bilateral breasts.        Pelvic/Bimanual Pap is not indicated today    Smoking History: Patient has is a current smoker at 1 packs per day and was referred to quit line.    Patient Navigation: Patient education provided. Access to services provided for patient through [facility] program. No interpreter provided. No transportation provided   Colorectal Cancer Screening: Per patient has never had colonoscopy completed No complaints today.    Breast and Cervical Cancer Risk Assessment: Patient does not have family history of breast cancer, known genetic mutations, or radiation treatment to the chest before age 89. Patient does not have history of cervical dysplasia, immunocompromised, or DES exposure in-utero.  Risk Scores as of 03/18/2022     Baker Janus           5-year 0.8 %   Lifetime 7.03 %            Last calculated by Demetrius Revel, LPN on 49/67/5916 at 11:20 AM        A: BCCCP exam without pap smear Complaint of off and on left breast pain x 2 years. Benign exam.   P: [facility]. Appointment scheduled 03/18/22.  Melodye Ped, NP 03/18/2022 11:30 AM

## 2022-03-18 NOTE — Patient Instructions (Signed)
Taught Amanda Morrow about self breast awareness and gave educational materials to take home. Patient did not need a Pap smear today due to hysterectomy per patient. Referred patient to the [facility] for diagnostic mammogram. Appointment scheduled for 03/18/22. Patient aware of appointment and will be there. Let patient know will follow up with her within the next couple weeks with results. Amanda Morrow verbalized understanding.  Melodye Ped, NP 11:32 AM

## 2022-03-23 ENCOUNTER — Ambulatory Visit: Payer: Self-pay | Attending: Cardiology | Admitting: Internal Medicine

## 2022-03-23 ENCOUNTER — Encounter: Payer: Self-pay | Admitting: Internal Medicine

## 2022-03-23 VITALS — BP 145/91 | HR 86 | Ht 68.0 in | Wt 222.6 lb

## 2022-03-23 DIAGNOSIS — Z8249 Family history of ischemic heart disease and other diseases of the circulatory system: Secondary | ICD-10-CM

## 2022-03-23 DIAGNOSIS — Z720 Tobacco use: Secondary | ICD-10-CM

## 2022-03-23 DIAGNOSIS — I10 Essential (primary) hypertension: Secondary | ICD-10-CM | POA: Insufficient documentation

## 2022-03-23 NOTE — Patient Instructions (Signed)
Medication Instructions:   Your physician recommends that you continue on your current medications as directed. Please refer to the Current Medication list given to you today.  *If you need a refill on your cardiac medications before your next appointment, please call your pharmacy*  Lab Work: NONE ordered at this time of appointment   If you have labs (blood work) drawn today and your tests are completely normal, you will receive your results only by: [place] (if you have MyChart) OR A paper copy in the mail If you have any lab test that is abnormal or we need to change your treatment, we will call you to review the results.  Testing/Procedures: NONE ordered at this time of appointment   Follow-Up: At [facility], you and your health needs are our priority.  As part of our continuing mission to provide you with exceptional heart care, we have created designated Provider Care Teams.  These Care Teams include your primary Cardiologist (physician) and Advanced Practice Providers (APPs -  Physician Assistants and Nurse Practitioners) who all work together to provide you with the care you need, when you need it.  Your next appointment:   As Needed if you develop new or worsen symptoms and/or symptoms fail to improve   The format for your next appointment:   In Person  Provider:   Claudina Lick, MD    Other Instructions  Important Information About Sugar

## 2022-03-23 NOTE — Progress Notes (Signed)
Cardiology Office Note  Date: 03/23/2022   ID: [name], DOB [birth year], MRN [id number]  PCP:  Manon Hilding, MD  Cardiologist:  Chalmers Guest, MD Electrophysiologist:  None   Reason for Office Visit: Follow-up of HTN   History of Present Illness: Amanda Morrow is a 51 y.o. female known to have HTN presented to cardiology clinic for follow-up visit.  Patient had NM stress test in 2019 when she had horizontal ST segment depressions during stress in leads III and aVF returning to baseline after less than 1 minute of recovery. Myocardial perfusion was normal. The nuclear stress test was deemed to be a low risk study. Echocardiogram showed normal LVEF and no valve abnormalities. She presented for cardiology follow-up visit today. Denied having any anginal symptoms in the interval time. Denied having DOE, dizziness, syncope, LE swelling, palpitations.  She continues to smoke cigarettes however cut down from 2 packs/day to 1.75 packs per day.  Denied alcohol use or illicit drug abuse. Family history of premature ASCVD per prior note.  Past Medical History:  Diagnosis Date   Anemia    Gastroesophageal reflux    Hypercholesterolemia    Hypertension     Past Surgical History:  Procedure Laterality Date   ABDOMINAL HYSTERECTOMY     APPENDECTOMY  2004   CESAREAN SECTION     CHOLECYSTECTOMY N/A 09/07/2020   Procedure: LAPAROSCOPIC CHOLECYSTECTOMY;  Surgeon: Virl Cagey, MD;  Location: [facility];  Service: General;  Laterality: N/A;   HYSTERECTOMY ABDOMINAL WITH SALPINGO-OOPHORECTOMY  09/18/2019   Procedure: TOTAL ABDOMINAL HYSTERECTOMY WITH BILATERAL SALPINGO-OOPHORECTOMY;  Surgeon: Florian Buff, MD;  Location: [facility];  Service: Gynecology;;    Current Outpatient Medications  Medication Sig Dispense Refill   acetaminophen (TYLENOL) 500 MG tablet Take 500-1,000 mg by mouth every 6 (six) hours as needed (pain).     amLODipine (NORVASC) 10 MG tablet TAKE ONE TABLET BY MOUTH  DAILY 90 tablet 1   BIOTIN PO Take 1 tablet by mouth in the morning.     hydrochlorothiazide (HYDRODIURIL) 25 MG tablet Take 25 mg by mouth in the morning.     Multiple Vitamins-Iron (MULTIVITAMINS WITH IRON) TABS tablet Take 1 tablet by mouth in the morning. Women's One-A-Day w/Iron     nitroGLYCERIN (NITROSTAT) 0.4 MG SL tablet Place 0.4 mg under the tongue every 5 (five) minutes x 3 doses as needed for chest pain.     Potassium 99 MG TABS Take 99 mg by mouth in the morning.     No current facility-administered medications for this visit.   Allergies:  Morphine and related   Social History: The patient  reports that she has been smoking cigarettes. She has a 18.75 pack-year smoking history. She has never used smokeless tobacco. She reports current alcohol use. She reports that she does not currently use drugs.   Family History: The patient's family history includes Cancer in her father and mother; Diabetes in her father and mother; Hypertension in her father and mother.   ROS:  Please see the history of present illness. Otherwise, complete review of systems is positive for none.  All other systems are reviewed and negative.   Physical Exam: VS:  BP (!) 145/91   Pulse 86   Ht '5\' 8"'$  (1.727 m)   Wt 222 lb 9.6 oz (101 kg)   LMP 08/24/2019   SpO2 96%   BMI 33.85 kg/m , BMI Body mass index is 33.85 kg/m.  Wt Readings from Last 3  Encounters:  03/23/22 222 lb 9.6 oz (101 kg)  03/18/22 218 lb 11.2 oz (99.2 kg)  09/07/20 212 lb 15.4 oz (96.6 kg)    General: Patient appears comfortable at rest. HEENT: Conjunctiva and lids normal, oropharynx clear with moist mucosa. Neck: Supple, no elevated JVP or carotid bruits, no thyromegaly. Lungs: Clear to auscultation, nonlabored breathing at rest. Cardiac: Regular rate and rhythm, no S3 or significant systolic murmur, no pericardial rub. Abdomen: Soft, nontender, no hepatomegaly, bowel sounds present, no guarding or rebound. Extremities: No  pitting edema, distal pulses 2+. Skin: Warm and dry. Musculoskeletal: No kyphosis. Neuropsychiatric: Alert and oriented x3, affect grossly appropriate.  ECG:  NSR, LVH with repolarization changes  Recent Labwork: No results found for requested labs within last 365 days.  No results found for: "CHOL", "TRIG", "HDL", "CHOLHDL", "VLDL", "LDLCALC", "LDLDIRECT"  Other Studies Reviewed Today: Echo from 2019 LVEF normal  NM stress test in 2019 No evidence of ischemia  Assessment and Plan: Patient is a 51 year old F known to have HTN presented to the cardiology clinic for follow-up visit.  # Family history premature ASCVD # Chest pain history (no angina in the last couple of years) -Patient had chest pain in the past for which she underwent NM stress test which showed horizontal ST segment depressions during stress in leads III and aVF returning to baseline after less than 1 minute of recovery. Myocardial perfusion was normal. Nuclear stress test was deemed to be low risk study. Echocardiogram showed normal LVEF and no LVH.  No valve abnormalities. EKG today showed normal sinus rhythm, LVH with repolarization abnormality.  She denied having angina or DOE or extreme fatigue. In the absence of symptoms, she will not need any further cardiac testing at this time.  # HTN, partially controlled -Continue amlodipine 10 mg once daily -Continue HCTZ 25 mg once daily -Counseled patient on intermittent fasting that has shown significant reduction in weight loss.  Patient motivated to try intermittent fasting and lose weight. -Follow-up with PCP for HTN management  # Nicotine abuse -Patient cut down cigarette smoking from 2 packs/day to 1.75 packs per day.  She is motivated to quit smoking along with her husband.  I have spent a total of 33 minutes with patient reviewing chart, EKGs, labs and examining patient as well as establishing an assessment and plan that was discussed with the patient.  > 50% of  time was spent in direct patient care.     Medication Adjustments/Labs and Tests Ordered: Current medicines are reviewed at length with the patient today.  Concerns regarding medicines are outlined above.   Tests Ordered: Orders Placed This Encounter  Procedures   EKG 12-Lead    Medication Changes: No orders of the defined types were placed in this encounter.   Disposition:  Follow up prn  Signed, Abran Gavigan Fidel Levy, MD, 03/23/2022 1:21 PM    [facility] at [facility] [address], [place], [place] [address]

## 2022-04-13 LAB — FECAL OCCULT BLOOD, IMMUNOCHEMICAL: Fecal Occult Bld: NEGATIVE

## 2022-04-14 ENCOUNTER — Telehealth: Payer: Self-pay

## 2022-04-14 NOTE — Telephone Encounter (Signed)
MyChart message with results has been emailed to the pt.

## 2022-06-22 ENCOUNTER — Other Ambulatory Visit: Payer: Self-pay | Admitting: Student

## 2022-09-19 ENCOUNTER — Telehealth: Payer: Self-pay | Admitting: Internal Medicine

## 2022-09-19 MED ORDER — AMLODIPINE BESYLATE 10 MG PO TABS: 10.0000 mg | ORAL_TABLET | Freq: Every day | ORAL | 1 refills | Status: DC

## 2022-09-19 NOTE — Telephone Encounter (Signed)
*  STAT* If patient is at the pharmacy, call can be transferred to refill team.   1. Which medications need to be refilled? (please list name of each medication and dose if known)   amLODipine (NORVASC) 10 MG tablet    2. Which pharmacy/location (including street and city if local pharmacy) is medication to be sent to?  [facility] [phone] - [place], [place] - [address] AT [facility] [address]    3. Do they need a 30 day or 90 day supply? 90

## 2022-09-19 NOTE — Telephone Encounter (Signed)
Completed.

## 2023-02-09 ENCOUNTER — Other Ambulatory Visit (HOSPITAL_COMMUNITY): Payer: Self-pay | Admitting: Obstetrics and Gynecology

## 2023-02-09 DIAGNOSIS — N63 Unspecified lump in unspecified breast: Secondary | ICD-10-CM

## 2023-02-17 ENCOUNTER — Other Ambulatory Visit: Payer: Self-pay

## 2023-02-17 ENCOUNTER — Inpatient Hospital Stay: Payer: Self-pay | Attending: Obstetrics and Gynecology | Admitting: Hematology and Oncology

## 2023-02-17 VITALS — BP 147/82 | Wt 204.6 lb

## 2023-02-17 DIAGNOSIS — N6320 Unspecified lump in the left breast, unspecified quadrant: Secondary | ICD-10-CM

## 2023-02-17 DIAGNOSIS — Z1211 Encounter for screening for malignant neoplasm of colon: Secondary | ICD-10-CM

## 2023-02-17 NOTE — Progress Notes (Signed)
Ms. Amanda Morrow is a 52 y.o. female who presents to [facility] clinic today with complaint of left breast lump.    Pap Smear: Pap not smear completed today due to hysterectomy.   Physical exam: Breasts Breasts symmetrical. No skin abnormalities bilateral breasts. No nipple retraction bilateral breasts. No nipple discharge bilateral breasts. No lymphadenopathy. No lumps palpated right breast. Left breast with fibrous feeling tissue in the inner lower quadrant.       Pelvic/Bimanual Pap is not indicated today    Smoking History: Patient has is a current smoker at less than 1 packs per day and referred to quit line.    Patient Navigation: Patient education provided. Access to services provided for patient through [facility] program. No interpreter provided. No transportation provided   Colorectal Cancer Screening: Per patient has never had colonoscopy completed No complaints today. FIT test given.    Breast and Cervical Cancer Risk Assessment: Patient does not have family history of breast cancer, known genetic mutations, or radiation treatment to the chest before age 46. Patient does not have history of cervical dysplasia, immunocompromised, or DES exposure in-utero.  Risk Scores as of Encounter on 02/17/2023     Amanda Morrow           5-year 0.83%   Lifetime 6.91%            Last calculated by Narda Rutherford, LPN on 36/64/4034 at  9:22 AM        A: BCCCP exam without pap smear Complaint of left breast mass as noted on exam.   P: Referred [facility] to [facility] for a diagnostic mammogram. Appointment scheduled 03/14/2023.  Pascal Lux, NP 02/17/2023 9:36 AM

## 2023-02-17 NOTE — Patient Instructions (Addendum)
Taught Amanda Morrow about self breast awareness and gave educational materials to take home. Patient did not need a Pap smear today due to benign hysterectomy in 2021. Referred patient to the [facility] for screening mammogram. Appointment scheduled for 02/17/2023. Patient aware of appointment and will be there. Let patient know will follow up with her within the next couple weeks with results. [name] verbalized understanding.  Pascal Lux, NP 9:14 AM

## 2023-03-14 ENCOUNTER — Ambulatory Visit (HOSPITAL_COMMUNITY)
Admission: RE | Admit: 2023-03-14 | Discharge: 2023-03-14 | Disposition: A | Discharge status: Home / Self Care | Payer: Self-pay | Source: Ambulatory Visit | Attending: Obstetrics and Gynecology | Admitting: Obstetrics and Gynecology

## 2023-03-14 ENCOUNTER — Encounter (HOSPITAL_COMMUNITY): Payer: Self-pay

## 2023-03-14 DIAGNOSIS — N63 Unspecified lump in unspecified breast: Secondary | ICD-10-CM

## 2023-03-18 ENCOUNTER — Other Ambulatory Visit: Payer: Self-pay | Admitting: Internal Medicine

## 2023-04-15 LAB — FECAL OCCULT BLOOD, IMMUNOCHEMICAL: Fecal Occult Bld: NEGATIVE

## 2023-08-03 ENCOUNTER — Ambulatory Visit: Payer: Self-pay | Attending: Nurse Practitioner | Admitting: Nurse Practitioner

## 2023-08-03 ENCOUNTER — Encounter: Payer: Self-pay | Admitting: Nurse Practitioner

## 2023-08-03 VITALS — BP 152/79 | HR 64 | Ht 68.0 in | Wt 217.2 lb

## 2023-08-03 DIAGNOSIS — F1290 Cannabis use, unspecified, uncomplicated: Secondary | ICD-10-CM

## 2023-08-03 DIAGNOSIS — Z87898 Personal history of other specified conditions: Secondary | ICD-10-CM

## 2023-08-03 DIAGNOSIS — E785 Hyperlipidemia, unspecified: Secondary | ICD-10-CM

## 2023-08-03 DIAGNOSIS — R42 Dizziness and giddiness: Secondary | ICD-10-CM

## 2023-08-03 DIAGNOSIS — E669 Obesity, unspecified: Secondary | ICD-10-CM

## 2023-08-03 DIAGNOSIS — I10 Essential (primary) hypertension: Secondary | ICD-10-CM

## 2023-08-03 MED ORDER — LOSARTAN POTASSIUM 25 MG PO TABS: 25.0000 mg | ORAL_TABLET | Freq: Every day | ORAL | 3 refills | Status: DC

## 2023-08-03 NOTE — Patient Instructions (Addendum)
 Medication Instructions:  Start Losartan 25 mg once a day  *If you need a refill on your cardiac medications before your next appointment, please call your pharmacy*  Lab Work: In 1 week we are going to need you to get a Cmet and lipid panel drawn.  If you have labs (blood work) drawn today and your tests are completely normal, you will receive your results only by: MyChart Message (if you have MyChart) OR A paper copy in the mail If you have any lab test that is abnormal or we need to change your treatment, we will call you to review the results.  Testing/Procedures: No testing  Follow-Up: At [facility], you and your health needs are our priority.  As part of our continuing mission to provide you with exceptional heart care, our providers are all part of one team.  This team includes your primary Cardiologist (physician) and Advanced Practice Providers or APPs (Physician Assistants and Nurse Practitioners) who all work together to provide you with the care you need, when you need it.  Your next appointment:   6-8 week(s)  Provider:   Lasalle Pointer, NP  We recommend signing up for the patient portal called "MyChart".  Sign up information is provided on this After Visit Summary.  MyChart is used to connect with patients for Virtual Visits (Telemedicine).  Patients are able to view lab/test results, encounter notes, upcoming appointments, etc.  Non-urgent messages can be sent to your provider as well.   To learn more about what you can do with MyChart, go to [email or web address].   Other Instructions: Please take your blood pressure daily for 2 weeks and send in a MyChart message. Please include heart rates. (One message at the end of the 2 weeks).   HOW TO TAKE YOUR BLOOD PRESSURE: Rest 5 minutes before taking your blood pressure. Don't smoke or drink caffeinated beverages for at least 30 minutes before. Take your blood pressure before (not after) you eat. Sit  comfortably with your back supported and both feet on the floor (don't cross your legs). Elevate your arm to heart level on a table or a desk. Use the proper sized cuff. It should fit smoothly and snugly around your bare upper arm. There should be enough room to slip a fingertip under the cuff. The bottom edge of the cuff should be 1 inch above the crease of the elbow. Ideally, take 3 measurements at one sitting and record the average.

## 2023-08-03 NOTE — Progress Notes (Signed)
 Cardiology Office Note:  .   Date: 08/03/2023 ID:  Amanda Morrow, DOB 1970/11/20, MRN [id number] PCP: Orest Bio, MD  [facility] Providers Cardiologist:  Lasalle Pointer, MD    History of Present Illness: .   Amanda Morrow is a 53 y.o. female with a past medical history of hypertension, hypercholesterolemia, anemia, prediabetes, and GERD, former smoker, presents today for hospital follow-up.  Past NST showed horizontal ST segment depressions during stress in leads III and aVF and returning to baseline after less than 1 minute of recovery.  Myocardial perfusion was normal.  NST deemed to be low risk.  Echocardiogram showed normal LVEF, no LVH.  Last seen by Dr. Mallipeddi on March 23, 2022.  Was overall doing well at the time.   Hospital admission at [facility] on June 07, 2023 for chest pain.  Stress test was negative.  Echo was pending at the time.  Today she presents for scheduled follow-up. She admits to some dizzy spells at times at work.  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  She attributes this to past history of labyrinthitis.  Does also sound like she has had a past history of undiagnosed vertigo.  Has episodes of numbness in feet and arms/legs tingling.  Admits to episode of dull ache along her chest a few days ago, has not recurred since.  Tells me she quit smoking. Denies any chest pain, shortness of breath, palpitations, syncope, presyncope, orthopnea, PND, swelling or significant weight changes, acute bleeding, or claudication.  Does admit to BP being elevated per her report.  Does occasionally smoke marijuana per her report.  Said she socially drinks alcohol.  ROS: Negative.  See HPI.  FH: Dad -  history of CABG either 4-6 vessel.  Studies Reviewed: Aaron Aas    EKG:  EKG Interpretation Date/Time:  Thursday Aug 03 2023 10:23:52 EDT Ventricular Rate:  66 PR Interval:  182 QRS Duration:  96 QT Interval:  436 QTC Calculation: 457 R Axis:   44  Text  Interpretation: Normal sinus rhythm ST & T wave abnormality, consider lateral ischemia No previous ECGs available Confirmed by Lasalle Pointer [phone]) on 08/03/2023 11:10:02 AM   Lexiscan  05/2023 [facility]): Impressions:  - Normal myocardial perfusion study  - No evidence for significant ischemia or scar is noted.  - Post stress: The ejection fraction was greater than 65%.   Echo 05/2023 [facility]): Summary   1. The left ventricle is normal in size with normal wall thickness.    2. The left ventricular systolic function is normal, LVEF is visually  estimated at > 55%.    3. The right ventricle is normal in size, with normal systolic function.       Physical Exam:   VS:  BP 139/70   Pulse 64   Ht 5\' 8"  (1.727 m)   Wt 217 lb 3.2 oz (98.5 kg)   LMP 08/24/2019   SpO2 99%   BMI 33.03 kg/m    Wt Readings from Last 3 Encounters:  08/03/23 217 lb 3.2 oz (98.5 kg)  02/17/23 204 lb 9.4 oz (92.8 kg)  03/23/22 222 lb 9.6 oz (101 kg)    GEN: Obese, 53 y.o. female in no acute distress NECK: No JVD; No carotid bruits CARDIAC: S1/S2, RRR, no murmurs, rubs, gallops RESPIRATORY:  Clear to auscultation without rales, wheezing or rhonchi  ABDOMEN: Soft, non-tender, non-distended EXTREMITIES:  No edema; No deformity   ASSESSMENT AND PLAN: .    Hx of chest  pain Admits to episode about 4 days ago, denies any chest pain.  Reviewed most recent Lexi scan from March 2025 at [facility] that was negative and found to be low risk.  Denies any recurrent chest pain.  No indication for ischemic valuation at this time.  Continue current medication regimen. Heart healthy diet and regular cardiovascular exercise encouraged.  Care and ED precautions discussed.  HTN Blood pressure is elevated today not at goal.  She confirms she has had elevated readings recently.  Will begin losartan  25 mg daily.  Will obtain CMET in 1 week. Discussed to monitor BP at home at least 2 hours after medications and  sitting for 5-10 minutes.  No other medication changes at this time. Heart healthy diet and regular cardiovascular exercise encouraged.   HLD Will obtain FLP in addition other lab work as mentioned above.  Heart healthy diet and regular cardiovascular exercise encouraged.   4. Dizziness Does not sound cardiac related and sounds related to possible vertigo.  Recommended to follow-up with PCP for further evaluation.  She verbalized understanding.  Care any precautions discussed.  5. Obesity Weight loss via diet and exercise encouraged. Discussed the impact being overweight would have on cardiovascular risk.  6.  Marijuana use Smoking cessation encouraged and discussed.    Dispo: Follow-up with me/APP in 6 to 8 weeks or sooner if anything changes.  Signed, Lasalle Pointer, NP

## 2023-08-14 ENCOUNTER — Other Ambulatory Visit (HOSPITAL_COMMUNITY)
Admission: RE | Admit: 2023-08-14 | Discharge: 2023-08-14 | Disposition: A | Discharge status: Home / Self Care | Payer: Self-pay | Source: Ambulatory Visit | Attending: Nurse Practitioner | Admitting: Nurse Practitioner

## 2023-08-14 DIAGNOSIS — I10 Essential (primary) hypertension: Secondary | ICD-10-CM | POA: Insufficient documentation

## 2023-08-14 DIAGNOSIS — E785 Hyperlipidemia, unspecified: Secondary | ICD-10-CM | POA: Insufficient documentation

## 2023-08-14 LAB — LIPID PANEL
Cholesterol: 215 mg/dL — ABNORMAL HIGH (ref 0–200)
HDL: 42 mg/dL (ref >40)
LDL Cholesterol: 138 mg/dL — ABNORMAL HIGH (ref 0–99)
Total CHOL/HDL Ratio: 5.1 ratio
Triglycerides: 173 mg/dL — ABNORMAL HIGH (ref <150)
VLDL: 35 mg/dL (ref 0–40)

## 2023-08-14 LAB — COMPREHENSIVE METABOLIC PANEL WITH GFR
ALT: 23 U/L (ref 0–44)
AST: 17 U/L (ref 15–41)
Albumin: 4.2 g/dL (ref 3.5–5.0)
Alkaline Phosphatase: 61 U/L (ref 38–126)
Anion gap: 9 (ref 5–15)
BUN: 15 mg/dL (ref 6–20)
CO2: 27 mmol/L (ref 22–32)
Calcium: 9.7 mg/dL (ref 8.9–10.3)
Chloride: 100 mmol/L (ref 98–111)
Creatinine, Ser: 0.66 mg/dL (ref 0.44–1.00)
GFR, Estimated: >60 mL/min (ref >60)
Glucose, Bld: 105 mg/dL — ABNORMAL HIGH (ref 70–99)
Potassium: 3.3 mmol/L — ABNORMAL LOW (ref 3.5–5.1)
Sodium: 136 mmol/L (ref 135–145)
Total Bilirubin: 0.5 mg/dL (ref 0.0–1.2)
Total Protein: 7.9 g/dL (ref 6.5–8.1)

## 2023-09-19 ENCOUNTER — Ambulatory Visit: Payer: Self-pay | Attending: Nurse Practitioner | Admitting: Nurse Practitioner

## 2023-09-19 ENCOUNTER — Encounter: Payer: Self-pay | Admitting: Nurse Practitioner

## 2023-09-19 VITALS — BP 134/72 | HR 61 | Ht 68.0 in | Wt 217.4 lb

## 2023-09-19 DIAGNOSIS — F1290 Cannabis use, unspecified, uncomplicated: Secondary | ICD-10-CM

## 2023-09-19 DIAGNOSIS — E785 Hyperlipidemia, unspecified: Secondary | ICD-10-CM

## 2023-09-19 DIAGNOSIS — E669 Obesity, unspecified: Secondary | ICD-10-CM

## 2023-09-19 DIAGNOSIS — Z8249 Family history of ischemic heart disease and other diseases of the circulatory system: Secondary | ICD-10-CM

## 2023-09-19 DIAGNOSIS — I10 Essential (primary) hypertension: Secondary | ICD-10-CM

## 2023-09-19 DIAGNOSIS — E876 Hypokalemia: Secondary | ICD-10-CM

## 2023-09-19 MED ORDER — ROSUVASTATIN CALCIUM 5 MG PO TABS: 5.0000 mg | ORAL_TABLET | Freq: Every day | ORAL | 1 refills | Status: DC

## 2023-09-19 MED ORDER — LOSARTAN POTASSIUM 50 MG PO TABS: 50.0000 mg | ORAL_TABLET | Freq: Every day | ORAL | 3 refills | Status: AC

## 2023-09-19 MED ORDER — POTASSIUM CHLORIDE ER 20 MEQ PO TBCR: 20.0000 meq | EXTENDED_RELEASE_TABLET | Freq: Every day | ORAL | 1 refills | Status: DC

## 2023-09-19 NOTE — Patient Instructions (Addendum)
 Medication Instructions:  Your physician has recommended you make the following change in your medication:  Please Increase Losartan  to 50 Mg daily  Please start Potassium 20 MeQ daily  Please start Crestor 5 Mg daily   Labwork: In 2 weeks at [facility]  In 3 months at [facility]   Testing/Procedures: None   Follow-Up: Your physician recommends that you schedule a follow-up appointment in: 6 Months   Any Other Special Instructions Will Be Listed Below (If Applicable).  If you need a refill on your cardiac medications before your next appointment, please call your pharmacy.

## 2023-09-19 NOTE — Progress Notes (Signed)
 Cardiology Office Note:  .   Date: 09/19/2023 ID:  Ortencia Rung, DOB 09-09-1970, MRN [id number] PCP: Atilano Deward ORN, MD  [facility] Providers Cardiologist:  Diannah SHAUNNA Maywood, MD    History of Present Illness: .   Amanda Morrow is a 53 y.o. female with a past medical history of hypertension, hypercholesterolemia, anemia, prediabetes, and GERD, former smoker, presents today for hospital follow-up.  Past NST showed horizontal ST segment depressions during stress in leads III and aVF and returning to baseline after less than 1 minute of recovery.  Myocardial perfusion was normal.  NST deemed to be low risk.  Echocardiogram showed normal LVEF, no LVH.  Last seen by Dr. Mallipeddi on March 23, 2022.  Was overall doing well at the time.   Hospital admission at [facility] on June 07, 2023 for chest pain.  Stress test was negative.  Echo was pending at the time.  08/03/2023 - Today she presents for scheduled follow-up. She admits to some dizzy spells at times at work.  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.  She attributes this to past history of labyrinthitis.  Does also sound like she has had a past history of undiagnosed vertigo.  Has episodes of numbness in feet and arms/legs tingling.  Admits to episode of dull ache along her chest a few days ago, has not recurred since.  Tells me she quit smoking. Denies any chest pain, shortness of breath, palpitations, syncope, presyncope, orthopnea, PND, swelling or significant weight changes, acute bleeding, or claudication.  Does admit to BP being elevated per her report.  Does occasionally smoke marijuana per her report.  Said she socially drinks alcohol.  09/19/2023 - Doing well. BP at home averaging 140's - 150's. Denies any chest pain, shortness of breath, palpitations, syncope, presyncope, dizziness, orthopnea, PND, swelling or significant weight changes, acute bleeding, or claudication.  ROS: Negative.  See HPI.  FH: Dad -  history of CABG  either 4-6 vessel.  Studies Reviewed: SABRA    EKG: EKG is not ordered today.      Lexiscan  05/2023 [facility]): Impressions:  - Normal myocardial perfusion study  - No evidence for significant ischemia or scar is noted.  - Post stress: The ejection fraction was greater than 65%.   Echo 05/2023 [facility]): Summary   1. The left ventricle is normal in size with normal wall thickness.    2. The left ventricular systolic function is normal, LVEF is visually  estimated at > 55%.    3. The right ventricle is normal in size, with normal systolic function.       Physical Exam:   VS:  BP 134/72   Pulse 61   Ht 5' 8 (1.727 m)   Wt 217 lb 6.4 oz (98.6 kg)   LMP 08/24/2019   SpO2 98%   BMI 33.06 kg/m    Wt Readings from Last 3 Encounters:  09/19/23 217 lb 6.4 oz (98.6 kg)  08/03/23 217 lb 3.2 oz (98.5 kg)  02/17/23 204 lb 9.4 oz (92.8 kg)    GEN: Obese, 53 y.o. female in no acute distress NECK: No JVD; No carotid bruits CARDIAC: S1/S2, RRR, no murmurs, rubs, gallops RESPIRATORY:  Clear to auscultation without rales, wheezing or rhonchi  ABDOMEN: Soft, non-tender, non-distended EXTREMITIES:  No edema; No deformity   ASSESSMENT AND PLAN: .     HTN Blood pressure is borderline elevated today, not at goal.  She confirms she has had elevated readings at home.  Will increase losartan  to 50 mg daily.  Will obtain BMET in 1 week. Discussed to monitor BP at home at least 2 hours after medications and sitting for 5-10 minutes.  No other medication changes at this time. Heart healthy diet and regular cardiovascular exercise encouraged.   HLD, family hx of CAD Most recent LDL 138. ASCVD score is elevated. Will begin Crestor  5 mg daily and repeat FLP/LFT in 2-3 months.  Heart healthy diet and regular cardiovascular exercise encouraged.   3. Obesity Weight loss via diet and exercise encouraged. Discussed the impact being overweight would have on cardiovascular risk.  4.  Marijuana  use Smoking cessation encouraged and discussed.   5. Hypokalemia Most recent potassium level was 3.3. Will begin potassium supplement and repeat BMET in 1 week. Continue to follow with PCP.   Dispo: Follow-up with me/APP in 6 to 8 weeks or sooner if anything changes.  Signed, Amanda Crate, NP

## 2023-09-23 ENCOUNTER — Ambulatory Visit: Payer: Self-pay | Admitting: Nurse Practitioner

## 2023-10-03 ENCOUNTER — Ambulatory Visit: Payer: Self-pay | Admitting: Student

## 2023-10-03 ENCOUNTER — Other Ambulatory Visit (HOSPITAL_COMMUNITY)
Admission: RE | Admit: 2023-10-03 | Discharge: 2023-10-03 | Disposition: A | Discharge status: Home / Self Care | Payer: Self-pay | Source: Ambulatory Visit | Attending: Nurse Practitioner | Admitting: Nurse Practitioner

## 2023-10-03 DIAGNOSIS — Z8249 Family history of ischemic heart disease and other diseases of the circulatory system: Secondary | ICD-10-CM | POA: Insufficient documentation

## 2023-10-03 DIAGNOSIS — E785 Hyperlipidemia, unspecified: Secondary | ICD-10-CM | POA: Insufficient documentation

## 2023-10-03 DIAGNOSIS — I10 Essential (primary) hypertension: Secondary | ICD-10-CM | POA: Insufficient documentation

## 2023-10-03 LAB — BASIC METABOLIC PANEL WITH GFR
Anion gap: 9 (ref 5–15)
BUN: 13 mg/dL (ref 6–20)
CO2: 25 mmol/L (ref 22–32)
Calcium: 9.6 mg/dL (ref 8.9–10.3)
Chloride: 105 mmol/L (ref 98–111)
Creatinine, Ser: 0.68 mg/dL (ref 0.44–1.00)
GFR, Estimated: >60 mL/min (ref >60)
Glucose, Bld: 118 mg/dL — ABNORMAL HIGH (ref 70–99)
Potassium: 3.7 mmol/L (ref 3.5–5.1)
Sodium: 139 mmol/L (ref 135–145)

## 2023-10-03 LAB — LIPID PANEL
Cholesterol: 167 mg/dL (ref 0–200)
HDL: 45 mg/dL (ref >40)
LDL Cholesterol: 91 mg/dL (ref 0–99)
Total CHOL/HDL Ratio: 3.7 ratio
Triglycerides: 157 mg/dL — ABNORMAL HIGH (ref <150)
VLDL: 31 mg/dL (ref 0–40)

## 2023-10-03 LAB — HEPATIC FUNCTION PANEL
ALT: 27 U/L (ref 0–44)
AST: 24 U/L (ref 15–41)
Albumin: 4.2 g/dL (ref 3.5–5.0)
Alkaline Phosphatase: 61 U/L (ref 38–126)
Bilirubin, Direct: <0.1 mg/dL (ref 0.0–0.2)
Total Bilirubin: 0.7 mg/dL (ref 0.0–1.2)
Total Protein: 7.8 g/dL (ref 6.5–8.1)

## 2023-10-03 NOTE — Telephone Encounter (Signed)
MyChart message sent to patient by provider.

## 2024-02-12 ENCOUNTER — Other Ambulatory Visit: Payer: Self-pay | Admitting: Obstetrics and Gynecology

## 2024-02-12 DIAGNOSIS — Z1231 Encounter for screening mammogram for malignant neoplasm of breast: Secondary | ICD-10-CM

## 2024-03-11 ENCOUNTER — Other Ambulatory Visit: Payer: Self-pay

## 2024-03-13 MED ORDER — AMLODIPINE BESYLATE 10 MG PO TABS: 10.0000 mg | ORAL_TABLET | Freq: Every day | ORAL | 1 refills | Status: AC

## 2024-03-22 ENCOUNTER — Ambulatory Visit: Payer: Self-pay | Admitting: Nurse Practitioner

## 2024-04-11 ENCOUNTER — Other Ambulatory Visit: Payer: Self-pay | Admitting: Medical Genetics

## 2024-04-16 ENCOUNTER — Other Ambulatory Visit (HOSPITAL_COMMUNITY)
Admission: RE | Admit: 2024-04-16 | Discharge: 2024-04-16 | Disposition: A | Discharge status: Home / Self Care | Payer: Self-pay | Source: Ambulatory Visit | Attending: Oncology | Admitting: Oncology

## 2024-04-18 ENCOUNTER — Ambulatory Visit: Payer: Self-pay

## 2024-04-18 ENCOUNTER — Ambulatory Visit
Admission: RE | Admit: 2024-04-18 | Discharge: 2024-04-18 | Disposition: A | Discharge status: Home / Self Care | Payer: Self-pay | Source: Ambulatory Visit | Attending: Obstetrics and Gynecology | Admitting: Obstetrics and Gynecology

## 2024-04-18 VITALS — BP 158/83 | Wt 214.0 lb

## 2024-04-18 DIAGNOSIS — Z1239 Encounter for other screening for malignant neoplasm of breast: Secondary | ICD-10-CM

## 2024-04-18 DIAGNOSIS — Z1231 Encounter for screening mammogram for malignant neoplasm of breast: Secondary | ICD-10-CM

## 2024-04-18 DIAGNOSIS — Z1211 Encounter for screening for malignant neoplasm of colon: Secondary | ICD-10-CM

## 2024-04-18 DIAGNOSIS — Z122 Encounter for screening for malignant neoplasm of respiratory organs: Secondary | ICD-10-CM

## 2024-04-18 NOTE — Progress Notes (Addendum)
 Ms. Ramie Palladino is a 54 y.o. female who presents to [facility] clinic today with no complaints.    Pap Smear: Pap smear not completed today. Last Pap smear was 10/29/2018 at [facility] and was normal. Per patient has history of four abnormal Pap smears between 1992-1994 that she was having completed every 19-months for follow-up. Patient stated all Pap smears have been normal since. Patient has a history of a hysterectomy 09/18/2019 due to Fibroids and AUB. Patient doesn't need any further Pap smears due to her history of a hysterectomy for benign reasons per BCCCP and ASCCP guidelines. Last Pap smear result is available in Epic.   Physical exam: Breasts Breasts symmetrical. No skin abnormalities bilateral breasts. No nipple retraction bilateral breasts. No nipple discharge bilateral breasts. No lymphadenopathy. No lumps palpated bilateral breasts. No complaints of pain or tenderness on exam.     MS 3D DIAG MAMMO BILAT BR (aka MM) Result Date: 03/14/2023 CLINICAL DATA:  54 year old female with palpable area of concern in the left breast. EXAM: DIGITAL DIAGNOSTIC BILATERAL MAMMOGRAM WITH TOMOSYNTHESIS AND CAD; ULTRASOUND LEFT BREAST LIMITED TECHNIQUE: Bilateral digital diagnostic mammography and breast tomosynthesis was performed. The images were evaluated with computer-aided detection. ; Targeted ultrasound examination of the left breast was performed. COMPARISON:  Previous exam(s). ACR Breast Density Category a: The breasts are almost entirely fatty. FINDINGS: No suspicious masses or calcifications are seen in either breast. Spot compression tomograms were performed at the site of palpable concern in the left breast with no definite abnormality seen. Physical examination at site of palpable concern in the upper left breast does not reveal any definite palpable masses. Targeted ultrasound of the left breast was performed. No suspicious masses or any other worrisome abnormality seen, only  normal-appearing fibroglandular tissue identified. IMPRESSION: 1. No mammographic or sonographic abnormalities at the site of palpable concern in the left breast. 2.  No mammographic evidence of malignancy in either breast. RECOMMENDATION: Screening mammogram in one year.(Code:SM-B-01Y) I have discussed the findings and recommendations with the patient. If applicable, a reminder letter will be sent to the patient regarding the next appointment. BI-RADS CATEGORY  1: Negative. Electronically Signed   By: Delon Music M.D.   On: 03/14/2023 15:10   MS DIGITAL SCREENING TOMO BILATERAL Result Date: 03/22/2022 CLINICAL DATA:  Screening. EXAM: DIGITAL SCREENING BILATERAL MAMMOGRAM WITH TOMOSYNTHESIS AND CAD TECHNIQUE: Bilateral screening digital craniocaudal and mediolateral oblique mammograms were obtained. Bilateral screening digital breast tomosynthesis was performed. The images were evaluated with computer-aided detection. COMPARISON:  Previous exam(s). ACR Breast Density Category b: There are scattered areas of fibroglandular density. FINDINGS: There are no findings suspicious for malignancy. IMPRESSION: No mammographic evidence of malignancy. A result letter of this screening mammogram will be mailed directly to the patient. RECOMMENDATION: Screening mammogram in one year. (Code:SM-B-01Y) BI-RADS CATEGORY  1: Negative. Electronically Signed   By: Almarie Daring M.D.   On: 03/22/2022 13:23    Pelvic/Bimanual Pap is not indicated today per BCCCP guidelines.   Smoking History: Patient is a former smoker that quit in November 2024. Patient started smoking at age 56 at 1 pack per day. Patient referred to the free lung cancer screening for a LDCT.   Patient Navigation: Patient education provided. Access to services provided for patient through BCCCP program.   Colorectal Cancer Screening: Per patient has never had colonoscopy completed. Patient completed a FIT test 04/12/2023 that was negative. FIT test  given to patient to complete. No complaints today.  Breast and Cervical Cancer Risk Assessment: Patient does not have family history of breast cancer, known genetic mutations, or radiation treatment to the chest before age 59. Patient does not have history of cervical dysplasia, immunocompromised, or DES exposure in-utero.  Risk Scores as of Encounter on 04/18/2024     Alisa           5-year 0.87%   Lifetime 6.79%            Last calculated by Silas, Ansyi K, CMA on 04/18/2024 at  9:33 AM        A: No complaints.  P: Referred patient to the [facility] for a screening mammogram on mobile unit. Appointment scheduled Thursday, April 18, 2024 at 1030.  Driscilla Wanda SQUIBB, RN 04/18/2024 9:40 AM

## 2024-04-18 NOTE — Patient Instructions (Signed)
 Explained breast self awareness with Ortencia Rung. Patient did not need a Pap smear today due to patient has a history of a hysterectomy for benign reasons. Let patient know that she doesn't need any further Pap smears due to her history of a hysterectomy for benign reasons. Referred patient to the [facility] for a screening mammogram on mobile unit. Appointment scheduled Thursday, April 18, 2024 at 1030. Patient aware of appointment and will be there. Let patient know the Breast Center will follow up with her within the next couple weeks with results of mammogram by letter or phone. Perlita Forbush verbalized understanding.  Denisse Whitenack, Wanda Ship, RN 9:40 AM

## 2024-04-23 LAB — GENECONNECT MOLECULAR SCREEN: Genetic Analysis Overall Interpretation: NEGATIVE

## 2024-05-17 ENCOUNTER — Ambulatory Visit: Admitting: Internal Medicine
